# Patient Record
Sex: Male | Born: 2000 | Race: White | Hispanic: No | Marital: Single | Smoking: Never smoker
Health system: Southern US, Community
[De-identification: ages and names within clinical notes are randomized; demographics above are authoritative.]

## PROBLEM LIST (undated history)

## (undated) DIAGNOSIS — J45909 Unspecified asthma, uncomplicated: Secondary | ICD-10-CM

## (undated) HISTORY — DX: Unspecified asthma, uncomplicated: J45.909

## (undated) HISTORY — PX: WISDOM TOOTH EXTRACTION: SHX21

---
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
## 2019-02-15 ENCOUNTER — Other Ambulatory Visit: Payer: Self-pay | Admitting: Pediatrics

## 2019-02-16 ENCOUNTER — Other Ambulatory Visit: Payer: Self-pay

## 2019-02-16 ENCOUNTER — Other Ambulatory Visit: Payer: Self-pay | Admitting: Pediatrics

## 2019-02-16 MED ORDER — ALBUTEROL SULFATE HFA 108 (90 BASE) MCG/ACT IN AERS: INHALATION_SPRAY | RESPIRATORY_TRACT | 0 refills | Status: DC

## 2019-02-16 NOTE — Telephone Encounter (Signed)
Patient called stating that the pharmacy needed an authorization for his albuterol (Proair). [facility] states that [facility] sent [facility] a request but it is still not filled.   Please advise.

## 2019-03-02 ENCOUNTER — Telehealth: Payer: Self-pay | Admitting: Allergy

## 2019-03-02 NOTE — Telephone Encounter (Signed)
Spoke with patient. Patient verbalizes that he does not have COVID, but has had a possible exposure. Patient was scheduled for a tele-vist on Monday with Dr. Maudie Mercury.

## 2019-03-02 NOTE — Telephone Encounter (Signed)
Mom called requesting refills for an albuterol inhaler, capsules for the nebulizer machine and prednisone. Jeffrey Olson has Covid and she said the prednisone is helping. [facility] [address]

## 2019-03-05 ENCOUNTER — Other Ambulatory Visit: Payer: Self-pay

## 2019-03-05 ENCOUNTER — Encounter: Payer: Self-pay | Admitting: Allergy

## 2019-03-05 ENCOUNTER — Ambulatory Visit (INDEPENDENT_AMBULATORY_CARE_PROVIDER_SITE_OTHER): Payer: Medicaid Other | Admitting: Allergy

## 2019-03-05 DIAGNOSIS — J4551 Severe persistent asthma with (acute) exacerbation: Secondary | ICD-10-CM

## 2019-03-05 DIAGNOSIS — U071 COVID-19: Secondary | ICD-10-CM | POA: Insufficient documentation

## 2019-03-05 DIAGNOSIS — J3089 Other allergic rhinitis: Secondary | ICD-10-CM

## 2019-03-05 MED ORDER — BREO ELLIPTA 200-25 MCG/INH IN AEPB: 1.0000 | INHALATION_SPRAY | Freq: Every day | RESPIRATORY_TRACT | 5 refills | Status: DC

## 2019-03-05 MED ORDER — FLOVENT HFA 220 MCG/ACT IN AERO: 2.0000 | INHALATION_SPRAY | Freq: Two times a day (BID) | RESPIRATORY_TRACT | 3 refills | Status: DC

## 2019-03-05 MED ORDER — PREDNISONE 10 MG PO TABS: ORAL_TABLET | ORAL | 0 refills | Status: DC

## 2019-03-05 MED ORDER — ALBUTEROL SULFATE HFA 108 (90 BASE) MCG/ACT IN AERS: INHALATION_SPRAY | RESPIRATORY_TRACT | 1 refills | Status: DC

## 2019-03-05 NOTE — Assessment & Plan Note (Signed)
Current symptoms most likely related to COVID-19 and had positive exposure to girlfriend who tested positive. This would be his second infection. Diagnosed with COVID in July 2020.  Monitor symptoms.  CDC Isolation guidelines.  You can be around others after:  10 days since symptoms first appeared and  24 hours with no fever without the use of fever-reducing medications and  Other symptoms of COVID-19 are improving  If have acute worsening of symptoms please go to ER/urgent care for further evaluation.  If you are able to, check pulse oximetry and if below 94% please go to ER.  The following supplements may help:   Vitamin C 500mg  twice a day.   Vitamin D3 2000 - 4000 u/day  Zinc 75-100 mg/day

## 2019-03-05 NOTE — Assessment & Plan Note (Signed)
No recent testing. Consider retesting in future. Stable with no medications.   May use over the counter antihistamines such as Zyrtec (cetirizine), Claritin (loratadine), Allegra (fexofenadine), or Xyzal (levocetirizine) daily as needed.

## 2019-03-05 NOTE — Progress Notes (Signed)
RE: Jeffrey Olson MRN: [id number] DOB: 10/27/2000 Date of Telemedicine Visit: 03/05/2019  Referring provider: No ref. provider found Primary care provider: Patient, No Pcp Per  Chief Complaint: Asthma (girlfriend has covid-19, had it in june, has been quarantined, going through inhalers, having SOB)   Telemedicine Follow Up Visit via Telephone: I connected with Jeffrey Olson for a follow up on 03/05/19 by telephone and verified that I am speaking with the correct person using two identifiers.   I discussed the limitations, risks, security and privacy concerns of performing an evaluation and management service by telephone and the availability of in person appointments. I also discussed with the patient that there may be a patient responsible charge related to this service. The patient expressed understanding and agreed to proceed.  Patient is at home/work. Provider is at the office.  Visit start time: 3:14PM Visit end time: 3:37PM Insurance consent/check in by: front desk Medical consent and medical assistant/nurse: Darreld Mcleanrina S.  History of Present Illness: He is a 18 y.o. male, who is being followed for asthma and allergic rhinitis. His previous allergy office visit was on 04/06/2018 with Dr. Selena BattenKim. Today is a regular follow up visit.  Asthma: Asthma has been flaring the last 1-2 weeks.  He had COVID on 09/14/2018 but now his girlfriend has COVID and 10 days ago he started experiencing rhinorrhea, shortness of breath, fatigue, muscle pains, headaches, anosmia. Most of his symptoms are improving except for the breathing. He has been using albuterol throughout the day with some benefit.  He is still taking Breo 200 1 puff once a day. Patient does not have Flovent or Asmanex. Stopped Xolair due to transportation issues but would like to restart as they helped in the past.   No additional prednisone since the last visit.   Other allergic rhinitis Symptoms stable with no medications.    Assessment and Plan: Jeffrey Olson is a 18 y.o. male with: COVID-19 virus infection Current symptoms most likely related to COVID-19 and had positive exposure to girlfriend who tested positive. This would be his second infection. Diagnosed with COVID in July 2020.  Monitor symptoms.  CDC Isolation guidelines.  You can be around others after:  10 days since symptoms first appeared and  24 hours with no fever without the use of fever-reducing medications and  Other symptoms of COVID-19 are improving  If have acute worsening of symptoms please go to ER/urgent care for further evaluation.  If you are able to, check pulse oximetry and if below 94% please go to ER.  The following supplements may help:   Vitamin C 500mg  twice a day.   Vitamin D3 2000 - 4000 u/day  Zinc 75-100 mg/day  Other allergic rhinitis No recent testing. Consider retesting in future. Stable with no medications.   May use over the counter antihistamines such as Zyrtec (cetirizine), Claritin (loratadine), Allegra (fexofenadine), or Xyzal (levocetirizine) daily as needed.  Return in about 4 weeks (around 04/02/2019).  Meds ordered this encounter  Medications  . predniSONE (DELTASONE) 10 MG tablet    Sig: Take prednisone 40mg  daily x 2 days, 30mg  daily x 2 days, 20mg  daily x 2 days and 10mg  daily x 2 days.    Dispense:  20 tablet    Refill:  0  . fluticasone furoate-vilanterol (BREO ELLIPTA) 200-25 MCG/INH AEPB    Sig: Inhale 1 puff into the lungs daily.    Dispense:  28 each    Refill:  5  . fluticasone (FLOVENT HFA) 220  MCG/ACT inhaler    Sig: Inhale 2 puffs into the lungs 2 (two) times daily. For the next 2 weeks in addition to [place].    Dispense:  1 Inhaler    Refill:  3  . albuterol (PROAIR HFA) 108 (90 Base) MCG/ACT inhaler    Sig: INHALE 2 PUFFS BY MOUTH EVERY 4 HOURS AS NEEDED FOR COUGH/WHEEZING    Dispense:  18 g    Refill:  1   Diagnostics: None.  Medication List:  Current Outpatient  Medications  Medication Sig Dispense Refill  . albuterol (PROAIR HFA) 108 (90 Base) MCG/ACT inhaler INHALE 2 PUFFS BY MOUTH EVERY 4 HOURS AS NEEDED FOR COUGH/WHEEZING 18 g 1  . fluticasone furoate-vilanterol (BREO ELLIPTA) 200-25 MCG/INH AEPB Inhale 1 puff into the lungs daily. 28 each 5  . ibuprofen (ADVIL,MOTRIN) 200 MG tablet Take 400 mg by mouth daily as needed for moderate pain.    Marland Kitchen ipratropium-albuterol (DUONEB) 0.5-2.5 (3) MG/3ML SOLN Inhale 3 mLs into the lungs every 4 (four) hours as needed. 180 mL 2  . naproxen (NAPROSYN) 500 MG tablet Take 1 tablet (500 mg total) by mouth 2 (two) times daily with a meal. 10 tablet 0  . pantoprazole (PROTONIX) 40 MG tablet Take 1 tablet (40 mg total) by mouth daily. 90 tablet 3  . fluticasone (FLOVENT HFA) 220 MCG/ACT inhaler Inhale 2 puffs into the lungs 2 (two) times daily. For the next 2 weeks in addition to [facility]. 1 Inhaler 3  . omalizumab (XOLAIR) 150 MG injection Inject 375 mg into the skin every 14 (fourteen) days. (Patient not taking: Reported on 03/05/2019) 6 vial 11  . predniSONE (DELTASONE) 10 MG tablet Take prednisone 40mg  daily x 2 days, 30mg  daily x 2 days, 20mg  daily x 2 days and 10mg  daily x 2 days. 20 tablet 0   Current Facility-Administered Medications  Medication Dose Route Frequency Provider Last Rate Last Admin  . omalizumab ) injection 375 mg  375 mg Subcutaneous Q14 Days , MD   375 mg at 05/17/18 1706   Allergies: Allergies  Allergen Reactions  . Apple Swelling    Oral swelling   . Peach [Prunus Persica] Swelling  . Plum Pulp Swelling  . Shellfish Allergy Other (See Comments)    Allergy testing    I reviewed his past medical history, social history, family history, and environmental history and no significant changes have been reported from his previous visit.  Review of Systems  Constitutional: Negative for appetite change, chills, fever and unexpected weight change.  HENT: Positive for  rhinorrhea. Negative for congestion.   Eyes: Negative for itching.  Respiratory: Positive for cough, chest tightness, shortness of breath and wheezing.   Gastrointestinal: Negative for abdominal pain.  Skin: Negative for rash.  Neurological: Negative for headaches.   Objective: Physical Exam Not obtained as encounter was done via telephone.  Patient was speaking in full sentences on the phone with no respiratory distress.   Previous notes and tests were reviewed.  I discussed the assessment and treatment plan with the patient. The patient was provided an opportunity to ask questions and all were answered. The patient agreed with the plan and demonstrated an understanding of the instructions. After visit summary/patient instructions available via e-mail. [email or web address]   The patient was advised to call back or seek an in-person evaluation if the symptoms worsen or if the condition fails to improve as anticipated.  I provided 23 minutes of non-face-to-face time during this encounter.  It  was my pleasure to participate in [place] Lumley's care today. Please feel free to contact me with any questions or concerns.   Sincerely,  Rexene Alberts, DO Allergy & Immunology  [facility] office: [phone] [facility] office: [place] office: [phone]

## 2019-03-05 NOTE — Patient Instructions (Addendum)
Asthma:   Start prednisone taper.  ADD on Flovent 220 2 puffs twice a day with spacer and rinse mouth afterwards for the next 2 weeks.   Daily controller medication(s):continue Breo 200 1 puff once a day and rinse mouth afterwards.   Prior to physical activity:May use albuterol rescue inhaler 2 puffs 5 to 15 minutes prior to strenuous physical activities.  Rescue medications:May use albuterol rescue inhaler 2 puffs or nebulizer every 4 to 6 hours as needed for shortness of breath, chest tightness, coughing, and wheezing. Monitor frequency of use.   During upper respiratory infections: Start Flovent  220 2 puffs twice a day for 1-2 weeks.  Asthma control goals:  Full participation in all desired activities (may need albuterol before activity) Albuterol use two times or less a week on average (not counting use with activity) Cough interfering with sleep two times or less a month Oral steroids no more than once a year No hospitalizations  Will discuss re-starting Xolair at next visit.   COVID-19:  Monitor symptoms.  CDC Isolation guidelines.  You can be around others after:  10 days since symptoms first appeared and  24 hours with no fever without the use of fever-reducing medications and  Other symptoms of COVID-19 are improving  If have acute worsening of symptoms please go to ER/urgent care for further evaluation.  If you are able to, check pulse oximetry and if below 94% please go to ER.  The following supplements may help:   Vitamin C 500mg  twice a day.   Vitamin D3 2000 - 4000 u/day  Zinc 75-100 mg/day  Other allergic rhinitis No recent testing. Consider retesting in future.  May use over the counter antihistamines such as Zyrtec (cetirizine), Claritin (loratadine), Allegra (fexofenadine), or Xyzal (levocetirizine) daily as needed.  Follow up in 4 weeks  Sincerely,  Rexene Alberts, DO Allergy & Immunology  [facility] [place] [facility] office: [phone] [facility] office: [place] office: [phone]

## 2019-04-02 ENCOUNTER — Ambulatory Visit: Payer: Medicaid Other | Admitting: Allergy

## 2019-04-02 NOTE — Progress Notes (Deleted)
Follow Up Note  RE: Jeffrey Olson MRN: [id number] DOB: 01-27-2001 Date of Office Visit: 04/02/2019  Referring provider: No ref. provider found Primary care provider: Patient, No Pcp Per  Chief Complaint: No chief complaint on file.  History of Present Illness: I had the pleasure of seeing Jeffrey Olson for a follow up visit at the [facility] [place] on 04/02/2019. He is a 19 y.o. male, who is being followed for allergic rhinitis, asthma, GERD and recent COVID-19 infection. His previous allergy office visit was on 03/05/2019 with Dr. Selena Batten via telemedicine. Today is a regular follow up visit.  COVID-19 virus infection Current symptoms most likely related to COVID-19 and had positive exposure to girlfriend who tested positive. This would be his second infection. Diagnosed with COVID in July 2020.  Monitor symptoms.  CDC Isolation guidelines. ? You can be around others after:  10 days since symptoms first appeared and  24 hours with no fever without the use of fever-reducing medications and  Other symptoms of COVID-19 are improving  If have acute worsening of symptoms please go to ER/urgent care for further evaluation.  If you are able to, check pulse oximetry and if below 94% please go to ER.  The following supplements may help:  ? Vitamin C 500mg  twice a day.  ? Vitamin D3 2000 - 4000 u/day ? Zinc 75-100 mg/day  Other allergic rhinitis No recent testing. Consider retesting in future. Stable with no medications.   May use over the counter antihistamines such as Zyrtec (cetirizine), Claritin (loratadine), Allegra (fexofenadine), or Xyzal (levocetirizine) daily as needed.  Return in about 4 weeks (around 04/02/2019).  Asthma, not well controlled, severe persistent, with acute exacerbation Patient has been having issues with his breathing and using rescue inhaler multiple times a day. He ran out of his maintenance inhalers 2 months ago and last Xolair injection  was in July 2019.  Today's spirometry showed: mild obstructive disease with 30% improvement in FEV1 post bronchodilator treatment.  He is currently having an asthma exacerbation most likely due to medication non-compliance.   Patient will call our biologics coordinator to set up restarting Xolair injections.   Start prednisone taper.  Daily controller medication(s):restart Breo 200 1 puff once a day and rinse mouth afterwards. Sample given.  ? Restart Xolair 375mg  injections every 2 weeks. Has Epipen on hand if needed.   Prior to physical activity:May use albuterol rescue inhaler 2 puffs 5 to 15 minutes prior to strenuous physical activities.  Rescue medications:May use albuterol rescue inhaler 2 puffs or nebulizer every 4 to 6 hours as needed for shortness of breath, chest tightness, coughing, and wheezing. Monitor frequency of use.   During upper respiratory infections: Start Asmanex 220 2 puffs twice a day for 1-2 weeks.   Assessment and Plan: Jeffrey Olson is a 19 y.o. male with: No problem-specific Assessment & Plan notes found for this encounter.  No follow-ups on file.  No orders of the defined types were placed in this encounter.  Lab Orders  No laboratory test(s) ordered today    Diagnostics: Spirometry:  Tracings reviewed. His effort: {Blank single:19197::"Good reproducible efforts.","It was hard to get consistent efforts and there is a question as to whether this reflects a maximal maneuver.","Poor effort, data can not be interpreted."} FVC: ***L FEV1: ***L, ***% predicted FEV1/FVC ratio: ***% Interpretation: {Blank single:19197::"Spirometry consistent with mild obstructive disease","Spirometry consistent with moderate obstructive disease","Spirometry consistent with severe obstructive disease","Spirometry consistent with possible restrictive disease","Spirometry consistent with mixed  obstructive and restrictive disease","Spirometry uninterpretable due to  technique","Spirometry consistent with normal pattern","No overt abnormalities noted given today's efforts"}.  Please see scanned spirometry results for details.  Skin Testing: {Blank single:19197::"Select foods","Environmental allergy panel","Environmental allergy panel and select foods","Food allergy panel","None","Deferred due to recent antihistamines use"}. Positive test to: ***. Negative test to: ***.  Results discussed with patient/family.   Medication List:  Current Outpatient Medications  Medication Sig Dispense Refill  . albuterol (PROAIR HFA) 108 (90 Base) MCG/ACT inhaler INHALE 2 PUFFS BY MOUTH EVERY 4 HOURS AS NEEDED FOR COUGH/WHEEZING 18 g 1  . fluticasone (FLOVENT HFA) 220 MCG/ACT inhaler Inhale 2 puffs into the lungs 2 (two) times daily. For the next 2 weeks in addition to [facility]. 1 Inhaler 3  . fluticasone furoate-vilanterol (BREO ELLIPTA) 200-25 MCG/INH AEPB Inhale 1 puff into the lungs daily. 28 each 5  . ibuprofen (ADVIL,MOTRIN) 200 MG tablet Take 400 mg by mouth daily as needed for moderate pain.    Marland Kitchen ipratropium-albuterol (DUONEB) 0.5-2.5 (3) MG/3ML SOLN Inhale 3 mLs into the lungs every 4 (four) hours as needed. 180 mL 2  . naproxen (NAPROSYN) 500 MG tablet Take 1 tablet (500 mg total) by mouth 2 (two) times daily with a meal. 10 tablet 0  . omalizumab (XOLAIR) 150 MG injection Inject 375 mg into the skin every 14 (fourteen) days. (Patient not taking: Reported on 03/05/2019) 6 vial 11  . pantoprazole (PROTONIX) 40 MG tablet Take 1 tablet (40 mg total) by mouth daily. 90 tablet 3  . predniSONE (DELTASONE) 10 MG tablet Take prednisone 40mg  daily x 2 days, 30mg  daily x 2 days, 20mg  daily x 2 days and 10mg  daily x 2 days. 20 tablet 0   Current Facility-Administered Medications  Medication Dose Route Frequency Provider Last Rate Last Admin  . omalizumab ) injection 375 mg  375 mg Subcutaneous Q14 Days , MD   375 mg at 05/17/18 1706    Allergies: Allergies  Allergen Reactions  . Apple Swelling    Oral swelling   . Peach [Prunus Persica] Swelling  . Plum Pulp Swelling  . Shellfish Allergy Other (See Comments)    Allergy testing    I reviewed his past medical history, social history, family history, and environmental history and no significant changes have been reported from his previous visit.  Review of Systems  Constitutional: Negative for appetite change, chills, fever and unexpected weight change.  HENT: Positive for rhinorrhea. Negative for congestion.   Eyes: Negative for itching.  Respiratory: Positive for cough, chest tightness, shortness of breath and wheezing.   Gastrointestinal: Negative for abdominal pain.  Skin: Negative for rash.  Neurological: Negative for headaches.   Objective: There were no vitals taken for this visit. There is no height or weight on file to calculate BMI. Physical Exam  Constitutional: He is oriented to person, place, and time. He appears well-developed and well-nourished.  HENT:  Head: Normocephalic and atraumatic.  Right Ear: External ear normal.  Left Ear: External ear normal.  Nose: Nose normal.  Mouth/Throat: Oropharynx is clear and moist.  Eyes: Conjunctivae and EOM are normal.  Cardiovascular: Normal rate, regular rhythm and normal heart sounds. Exam reveals no gallop and no friction rub.  No murmur heard. Pulmonary/Chest: Effort normal. He has wheezes. He has no rales.  Musculoskeletal:     Cervical back: Neck supple.  Lymphadenopathy:    He has no cervical adenopathy.  Neurological: He is alert and oriented to person, place, and time.  Skin: Skin is warm. No rash noted.  Psychiatric: He has a normal mood and affect. His behavior is normal.  Nursing note and vitals reviewed.  Previous notes and tests were reviewed. The plan was reviewed with the patient/family, and all questions/concerned were addressed.  It was my pleasure to see Avinash today and  participate in his care. Please feel free to contact me with any questions or concerns.  Sincerely,  Rexene Alberts, DO Allergy & Immunology  [facility] office: [phone] [facility] office: [place] office: [phone]

## 2019-05-14 ENCOUNTER — Other Ambulatory Visit: Payer: Self-pay | Admitting: Allergy

## 2019-05-15 ENCOUNTER — Other Ambulatory Visit: Payer: Self-pay | Admitting: *Deleted

## 2019-05-15 ENCOUNTER — Telehealth: Payer: Self-pay | Admitting: *Deleted

## 2019-05-15 MED ORDER — IPRATROPIUM-ALBUTEROL 0.5-2.5 (3) MG/3ML IN SOLN: 3.0000 mL | RESPIRATORY_TRACT | 1 refills | Status: DC | PRN

## 2019-05-15 NOTE — Telephone Encounter (Signed)
PA has been submitted for Breo through [facility] and is currently suspended/ pending.

## 2019-05-16 NOTE — Telephone Encounter (Signed)
PA for Virgel Bouquet has been approved. PA form has been faxed to patient's pharmacy, labeled, and placed in bulk scanning.

## 2019-05-28 ENCOUNTER — Other Ambulatory Visit: Payer: Self-pay | Admitting: *Deleted

## 2019-05-30 ENCOUNTER — Other Ambulatory Visit: Payer: Self-pay | Admitting: *Deleted

## 2019-08-02 ENCOUNTER — Other Ambulatory Visit: Payer: Self-pay | Admitting: Allergy

## 2019-08-07 NOTE — Progress Notes (Signed)
Follow Up Note  RE: Jeffrey Olson MRN: [id number] DOB: 10/28/2000 Date of Office Visit: 08/08/2019  Referring provider: No ref. provider found Primary care provider: Patient, No Pcp Per  Chief Complaint: Follow-up and Asthma  History of Present Illness: I had the pleasure of seeing Jeffrey Olson for a follow up visit at the [facility] [place] on 08/08/2019. He is a 19 y.o. male, who is being followed for asthma and allergic rhinitis. His previous allergy office visit was on 03/05/2019 with Dr. Selena Batten via telemedicine. Today is a regular follow up visit. He is accompanied today by his mother who provided/contributed to the history.  Up to date with COVID-19 vaccine: no   Asthma: Patient has been having issues with shortness of breath, wheezing. He is experiencing these issues almost on a daily basis.  Patient was on Xolair injections with good benefit and no reactions but had a hard time coming in every 2 weeks.   Currently on Breo 200 1 puff 4 days out of the week and taking it twice a day instead of once a day.  Taking albuterol 2 times a day which helps and has been running out of albuterol and using nebulizer instead at times.  No specific triggers noted.   Currently smoking marijuana.  Patient took prednisone which he had at home for 3 days with some benefit.  Patient tried Spiriva Respimat for about 6 months with unknown benefit.  Other allergic rhinitis No issues and not taking any antihistamines. Had large localized reactions the last time they did skin prick testing.  Foods: Fresh tree fruits causing perioral pruritus.  Tolerates shrimp but avoiding other shellfish due to positive skin testing in the past.   2015 skin testing was positive to grass, weed, ragweed, trees, mold, dust mite, cat, dog, cockroach. Positive to shrimp and crab.  Assessment and Plan: Jeffrey Olson is a 19 y.o. male with: Not well controlled severe persistent asthma Patient has been  having issues with his breathing and using rescue inhaler twice a day with good benefit. No triggers noted. Took prednisone for 3 days due to his asthma this week. Last Xolair injection was in March 2020 which helped but found it difficult to come into the office every 2 weeks.  Today's ACT score 9.  Today's spirometry showed: normal but recently took prednisone.  Get bloodwork in 2 weeks (checking eosinophils and IgE level) - no prednisone for 2 weeks before the bloodwork.  Depending on results will choose asthma biologics. Preferably one that is given less frequently and offers self injection at home.   Discussed smoking cessation.   Daily controller medication(s):continue Breo 200 1 puff ONCE a day and rinse mouth afterwards.   Start Spiriva respimat 1.24mcg 2 puffs once a day. Sample given. Demonstrated proper use.   May use albuterol rescue inhaler 2 puffs or nebulizer every 4 to 6 hours as needed for shortness of breath, chest tightness, coughing, and wheezing. May use albuterol rescue inhaler 2 puffs 5 to 15 minutes prior to strenuous physical activities. Monitor frequency of use.   Repeat spirometry at next visit.   Other allergic rhinitis Past history - 2015 skin testing was positive to grass, weed, ragweed, trees, mold, dust mite, cat, dog, cockroach. Interim history - Stable with no medications.   May use over the counter antihistamines such as Zyrtec (cetirizine), Claritin (loratadine), Allegra (fexofenadine), or Xyzal (levocetirizine) daily as needed.  Will check allergy panel via bloodwork with above lab orders  for asthma.   Pollen-food allergy Fresh tree fruits cause perioral pruritus.  Discussed that his food triggered oral and throat symptoms are likely caused by oral food allergy syndrome (OFAS). This is caused by cross reactivity of pollen with fresh fruits and vegetables, and nuts. Symptoms are usually localized in the form of itching and burning in mouth and  throat. Very rarely it can progress to more severe symptoms. Eating foods in cooked or processed forms usually minimizes symptoms. I recommended avoidance of eating the problem foods, especially during the peak season(s). Sometimes, OFAS can induce severe throat swelling or even a systemic reaction; with such instance, I advised them to report to a local ER. A list of common pollens and food cross-reactivities was provided to the patient.   Adverse reaction to food, subsequent encounter Past history - 2015 skin testing positive to shrimp and crab. Interim history - tolerates shrimp with no issues.   Continue to avoid shellfish that bothers you. Will get bloodwork.  I have prescribed epinephrine injectable and demonstrated proper use. For mild symptoms you can take over the counter antihistamines such as Benadryl and monitor symptoms closely. If symptoms worsen or if you have severe symptoms including breathing issues, throat closure, significant swelling, whole body hives, severe diarrhea and vomiting, lightheadedness then inject epinephrine and seek immediate medical care afterwards.  Food action plan given.   Discussed that his food triggered oral and throat symptoms are likely caused by oral food allergy syndrome (OFAS). This is caused by cross reactivity of pollen with fresh fruits and vegetables, and nuts. Symptoms are usually localized in the form of itching and burning in mouth and throat. Very rarely it can progress to more severe symptoms. Eating foods in cooked or processed forms usually minimizes symptoms. I recommended avoidance of eating the problem foods, especially during the peak season(s). Sometimes, OFAS can induce severe throat swelling or even a systemic reaction. A list of common pollens and food cross-reactivities was provided to the patient.   Return in about 4 weeks (around 09/05/2019).  Meds ordered this encounter  Medications  . fluticasone furoate-vilanterol (BREO ELLIPTA)  200-25 MCG/INH AEPB    Sig: Inhale 1 puff into the lungs daily. Rinse mouth after each use.    Dispense:  28 each    Refill:  5  . albuterol (VENTOLIN HFA) 108 (90 Base) MCG/ACT inhaler    Sig: INHALE 2 PUFFS BY MOUTH EVERY 4 HOURS AS NEEDED FOR COUGH/WHEEZING    Dispense:  18 g    Refill:  2  . Tiotropium Bromide Monohydrate (SPIRIVA RESPIMAT) 1.25 MCG/ACT AERS    Sig: 2 puffs once a day    Dispense:  4 g    Refill:  5  . ipratropium-albuterol (DUONEB) 0.5-2.5 (3) MG/3ML SOLN    Sig: Inhale 3 mLs into the lungs every 4 (four) hours as needed.    Dispense:  180 mL    Refill:  1  . EPINEPHrine 0.3 mg/0.3 mL IJ SOAJ injection    Sig: Inject 0.3 mLs (0.3 mg total) into the muscle as needed for anaphylaxis.    Dispense:  1 each    Refill:  2    May dispense generic Mylan brand.    Lab Orders     Allergens w/Total IgE Area 2     CBC with Differential/Platelet     Allergen Profile, Shellfish  Diagnostics: Spirometry:  Tracings reviewed. His effort: Good reproducible efforts. FVC: 5.47L FEV1: 4.23L, 93% predicted FEV1/FVC ratio: 77%  Interpretation: Spirometry consistent with normal pattern.  Please see scanned spirometry results for details.  Medication List:  Current Outpatient Medications  Medication Sig Dispense Refill  . albuterol (VENTOLIN HFA) 108 (90 Base) MCG/ACT inhaler INHALE 2 PUFFS BY MOUTH EVERY 4 HOURS AS NEEDED FOR COUGH/WHEEZING 18 g 2  . fluticasone furoate-vilanterol (BREO ELLIPTA) 200-25 MCG/INH AEPB Inhale 1 puff into the lungs daily. Rinse mouth after each use. 28 each 5  . ibuprofen (ADVIL,MOTRIN) 200 MG tablet Take 400 mg by mouth daily as needed for moderate pain.    Marland Kitchen ipratropium-albuterol (DUONEB) 0.5-2.5 (3) MG/3ML SOLN Inhale 3 mLs into the lungs every 4 (four) hours as needed. 180 mL 1  . pantoprazole (PROTONIX) 40 MG tablet Take 1 tablet (40 mg total) by mouth daily. 90 tablet 3  . EPINEPHrine 0.3 mg/0.3 mL IJ SOAJ injection Inject 0.3 mLs (0.3 mg  total) into the muscle as needed for anaphylaxis. 1 each 2  . naproxen (NAPROSYN) 500 MG tablet Take 1 tablet (500 mg total) by mouth 2 (two) times daily with a meal. (Patient not taking: Reported on 08/08/2019) 10 tablet 0  . Tiotropium Bromide Monohydrate (SPIRIVA RESPIMAT) 1.25 MCG/ACT AERS 2 puffs once a day 4 g 5   Current Facility-Administered Medications  Medication Dose Route Frequency Provider Last Rate Last Admin  . omalizumab Geoffry Paradise) injection 375 mg  375 mg Subcutaneous Q14 Days Marcelyn Bruins, MD   375 mg at 05/17/18 1706   Allergies: Allergies  Allergen Reactions  . Apple Swelling    Oral swelling   . Peach [Prunus Persica] Swelling  . Plum Pulp Swelling  . Shellfish Allergy Other (See Comments)    Allergy testing    I reviewed his past medical history, social history, family history, and environmental history and no significant changes have been reported from his previous visit.  Review of Systems  Constitutional: Negative for appetite change, chills, fever and unexpected weight change.  HENT: Negative for congestion and rhinorrhea.   Eyes: Negative for itching.  Respiratory: Positive for shortness of breath and wheezing. Negative for cough and chest tightness.   Gastrointestinal: Negative for abdominal pain.  Skin: Negative for rash.  Allergic/Immunologic: Positive for environmental allergies and food allergies.  Neurological: Negative for headaches.   Objective: BP 112/82 (BP Location: Right Arm, Patient Position: Sitting, Cuff Size: Large)   Pulse 76   Temp 98.2 F (36.8 C) (Temporal)   Resp 18   Ht 5\' 9"  (1.753 m)   Wt 222 lb 6.4 oz (100.9 kg)   SpO2 97%   BMI 32.84 kg/m  Body mass index is 32.84 kg/m. Physical Exam  Constitutional: He is oriented to person, place, and time. He appears well-developed and well-nourished.  HENT:  Head: Normocephalic and atraumatic.  Right Ear: External ear normal.  Left Ear: External ear normal.  Nose:  Nose normal.  Mouth/Throat: Oropharynx is clear and moist.  Eyes: Conjunctivae and EOM are normal.  Cardiovascular: Normal rate, regular rhythm and normal heart sounds. Exam reveals no gallop and no friction rub.  No murmur heard. Pulmonary/Chest: Effort normal and breath sounds normal. He has no wheezes. He has no rales.  Musculoskeletal:     Cervical back: Neck supple.  Neurological: He is alert and oriented to person, place, and time.  Skin: Skin is warm. No rash noted.  Psychiatric: He has a normal Olson and affect. His behavior is normal.  Nursing note and vitals reviewed.  Previous notes and tests were reviewed. The  plan was reviewed with the patient/family, and all questions/concerned were addressed.  It was my pleasure to see Jeffrey Olson today and participate in his care. Please feel free to contact me with any questions or concerns.  Sincerely,  Jeffrey Mood, DO Allergy & Immunology  [facility] office: [phone] [facility] [place] office: [phone] [place] office: [phone]

## 2019-08-08 ENCOUNTER — Ambulatory Visit (INDEPENDENT_AMBULATORY_CARE_PROVIDER_SITE_OTHER): Payer: Medicaid Other | Admitting: Allergy

## 2019-08-08 ENCOUNTER — Encounter: Payer: Self-pay | Admitting: Allergy

## 2019-08-08 ENCOUNTER — Other Ambulatory Visit: Payer: Self-pay

## 2019-08-08 VITALS — BP 112/82 | HR 76 | Temp 98.2°F | Resp 18 | Ht 69.0 in | Wt 222.4 lb

## 2019-08-08 DIAGNOSIS — T781XXD Other adverse food reactions, not elsewhere classified, subsequent encounter: Secondary | ICD-10-CM | POA: Diagnosis not present

## 2019-08-08 DIAGNOSIS — T781XXA Other adverse food reactions, not elsewhere classified, initial encounter: Secondary | ICD-10-CM | POA: Insufficient documentation

## 2019-08-08 DIAGNOSIS — T7819XD Other adverse food reactions, not elsewhere classified, subsequent encounter: Secondary | ICD-10-CM | POA: Insufficient documentation

## 2019-08-08 DIAGNOSIS — J4550 Severe persistent asthma, uncomplicated: Secondary | ICD-10-CM

## 2019-08-08 DIAGNOSIS — J3089 Other allergic rhinitis: Secondary | ICD-10-CM

## 2019-08-08 MED ORDER — BREO ELLIPTA 200-25 MCG/INH IN AEPB: 1.0000 | INHALATION_SPRAY | Freq: Every day | RESPIRATORY_TRACT | 5 refills | Status: DC

## 2019-08-08 MED ORDER — EPINEPHRINE 0.3 MG/0.3ML IJ SOAJ: 0.3000 mg | INTRAMUSCULAR | 2 refills | Status: DC | PRN

## 2019-08-08 MED ORDER — IPRATROPIUM-ALBUTEROL 0.5-2.5 (3) MG/3ML IN SOLN: 3.0000 mL | RESPIRATORY_TRACT | 1 refills | Status: DC | PRN

## 2019-08-08 MED ORDER — SPIRIVA RESPIMAT 1.25 MCG/ACT IN AERS: INHALATION_SPRAY | RESPIRATORY_TRACT | 5 refills | Status: DC

## 2019-08-08 MED ORDER — ALBUTEROL SULFATE HFA 108 (90 BASE) MCG/ACT IN AERS: INHALATION_SPRAY | RESPIRATORY_TRACT | 2 refills | Status: DC

## 2019-08-08 NOTE — Assessment & Plan Note (Addendum)
Past history - 2015 skin testing positive to shrimp and crab. Interim history - tolerates shrimp with no issues.   Continue to avoid shellfish that bothers you. Will get bloodwork.  I have prescribed epinephrine injectable and demonstrated proper use. For mild symptoms you can take over the counter antihistamines such as Benadryl and monitor symptoms closely. If symptoms worsen or if you have severe symptoms including breathing issues, throat closure, significant swelling, whole body hives, severe diarrhea and vomiting, lightheadedness then inject epinephrine and seek immediate medical care afterwards.  Food action plan given.   Discussed that his food triggered oral and throat symptoms are likely caused by oral food allergy syndrome (OFAS). This is caused by cross reactivity of pollen with fresh fruits and vegetables, and nuts. Symptoms are usually localized in the form of itching and burning in mouth and throat. Very rarely it can progress to more severe symptoms. Eating foods in cooked or processed forms usually minimizes symptoms. I recommended avoidance of eating the problem foods, especially during the peak season(s). Sometimes, OFAS can induce severe throat swelling or even a systemic reaction. A list of common pollens and food cross-reactivities was provided to the patient.

## 2019-08-08 NOTE — Assessment & Plan Note (Addendum)
Fresh tree fruits cause perioral pruritus.  Discussed that his food triggered oral and throat symptoms are likely caused by oral food allergy syndrome (OFAS). This is caused by cross reactivity of pollen with fresh fruits and vegetables, and nuts. Symptoms are usually localized in the form of itching and burning in mouth and throat. Very rarely it can progress to more severe symptoms. Eating foods in cooked or processed forms usually minimizes symptoms. I recommended avoidance of eating the problem foods, especially during the peak season(s). Sometimes, OFAS can induce severe throat swelling or even a systemic reaction; with such instance, I advised them to report to a local ER. A list of common pollens and food cross-reactivities was provided to the patient.

## 2019-08-08 NOTE — Assessment & Plan Note (Addendum)
Patient has been having issues with his breathing and using rescue inhaler twice a day with good benefit. No triggers noted. Took prednisone for 3 days due to his asthma this week. Last Xolair injection was in March 2020 which helped but found it difficult to come into the office every 2 weeks.  Today's ACT score 9.  Today's spirometry showed: normal but recently took prednisone.  Get bloodwork in 2 weeks (checking eosinophils and IgE level) - no prednisone for 2 weeks before the bloodwork.  Depending on results will choose asthma biologics. Preferably one that is given less frequently and offers self injection at home.   Discussed smoking cessation.   Daily controller medication(s):continue Breo 200 1 puff ONCE a day and rinse mouth afterwards.   Start Spiriva respimat 1.76mcg 2 puffs once a day. Sample given. Demonstrated proper use.   May use albuterol rescue inhaler 2 puffs or nebulizer every 4 to 6 hours as needed for shortness of breath, chest tightness, coughing, and wheezing. May use albuterol rescue inhaler 2 puffs 5 to 15 minutes prior to strenuous physical activities. Monitor frequency of use.   Repeat spirometry at next visit.

## 2019-08-08 NOTE — Assessment & Plan Note (Signed)
Past history - 2015 skin testing was positive to grass, weed, ragweed, trees, mold, dust mite, cat, dog, cockroach. Interim history - Stable with no medications.   May use over the counter antihistamines such as Zyrtec (cetirizine), Claritin (loratadine), Allegra (fexofenadine), or Xyzal (levocetirizine) daily as needed.  Will check allergy panel via bloodwork with above lab orders for asthma.

## 2019-08-08 NOTE — Patient Instructions (Addendum)
Asthma:   Get bloodwork in 2 weeks - no prednisone for 2 weeks before the bloodwork.  Depending on results we will start you on an injectable medication for asthma.   Stop smoking!  Daily controller medication(s):continue Breo 200 1 puff ONCE a day and rinse mouth afterwards.   Start Spiriva respimat 1.65mcg 2 puffs once a day. Sample given. Demonstrated proper use.   May use albuterol rescue inhaler 2 puffs or nebulizer every 4 to 6 hours as needed for shortness of breath, chest tightness, coughing, and wheezing. May use albuterol rescue inhaler 2 puffs 5 to 15 minutes prior to strenuous physical activities. Monitor frequency of use.  Asthma control goals:  Full participation in all desired activities (may need albuterol before activity) Albuterol use two times or less a week on average (not counting use with activity) Cough interfering with sleep two times or less a month Oral steroids no more than once a year No hospitalizations  Other allergic rhinitis  Will get bloodwork.   May use over the counter antihistamines such as Zyrtec (cetirizine), Claritin (loratadine), Allegra (fexofenadine), or Xyzal (levocetirizine) daily as needed.  Food:  Continue to avoid shellfish that bothers you. Will get bloodwork.  I have prescribed epinephrine injectable and demonstrated proper use. For mild symptoms you can take over the counter antihistamines such as Benadryl and monitor symptoms closely. If symptoms worsen or if you have severe symptoms including breathing issues, throat closure, significant swelling, whole body hives, severe diarrhea and vomiting, lightheadedness then inject epinephrine and seek immediate medical care afterwards.  Food action plan given.   Discussed that his food triggered oral and throat symptoms are likely caused by oral food allergy syndrome (OFAS). This is caused by cross reactivity of pollen with fresh fruits and vegetables, and nuts. Symptoms are usually  localized in the form of itching and burning in mouth and throat. Very rarely it can progress to more severe symptoms. Eating foods in cooked or processed forms usually minimizes symptoms. I recommended avoidance of eating the problem foods, especially during the peak season(s). Sometimes, OFAS can induce severe throat swelling or even a systemic reaction. A list of common pollens and food cross-reactivities was provided to the patient.   Follow up in 4 weeks or sooner if needed.

## 2019-08-09 ENCOUNTER — Telehealth: Payer: Self-pay | Admitting: *Deleted

## 2019-08-09 NOTE — Telephone Encounter (Signed)
PA for Spiriva 1.25 has been initiated through [employer] and has been approved. PA form has been faxed to patient's pharmacy, labeled, and placed in bulk scanning.

## 2019-10-02 ENCOUNTER — Other Ambulatory Visit: Payer: Self-pay | Admitting: Allergy

## 2019-11-27 ENCOUNTER — Encounter: Payer: Self-pay | Admitting: Allergy & Immunology

## 2019-11-27 ENCOUNTER — Ambulatory Visit (INDEPENDENT_AMBULATORY_CARE_PROVIDER_SITE_OTHER): Payer: Medicaid Other | Admitting: Allergy & Immunology

## 2019-11-27 ENCOUNTER — Other Ambulatory Visit: Payer: Self-pay

## 2019-11-27 VITALS — BP 124/76 | HR 86 | Temp 98.4°F | Resp 18 | Ht 69.0 in | Wt 225.0 lb

## 2019-11-27 DIAGNOSIS — J4540 Moderate persistent asthma, uncomplicated: Secondary | ICD-10-CM

## 2019-11-27 DIAGNOSIS — T781XXD Other adverse food reactions, not elsewhere classified, subsequent encounter: Secondary | ICD-10-CM | POA: Diagnosis not present

## 2019-11-27 DIAGNOSIS — T7800XD Anaphylactic reaction due to unspecified food, subsequent encounter: Secondary | ICD-10-CM

## 2019-11-27 DIAGNOSIS — J3089 Other allergic rhinitis: Secondary | ICD-10-CM

## 2019-11-27 DIAGNOSIS — J4550 Severe persistent asthma, uncomplicated: Secondary | ICD-10-CM | POA: Diagnosis not present

## 2019-11-27 DIAGNOSIS — J302 Other seasonal allergic rhinitis: Secondary | ICD-10-CM | POA: Diagnosis not present

## 2019-11-27 MED ORDER — ALBUTEROL SULFATE HFA 108 (90 BASE) MCG/ACT IN AERS
2.0000 | INHALATION_SPRAY | RESPIRATORY_TRACT | 1 refills | Status: DC | PRN
Start: 2019-11-27 — End: 2020-03-18

## 2019-11-27 MED ORDER — EPINEPHRINE 0.3 MG/0.3ML IJ SOAJ: 0.3000 mg | INTRAMUSCULAR | 1 refills | Status: DC | PRN

## 2019-11-27 NOTE — Patient Instructions (Addendum)
1. Moderate persistent asthma without complication - Lung testing looks great today. - Stop the Breo and Spiriva and start Trelegy once puff once daily instead. - This contains all of the medications that you are getting, but just in one inhaler.  - Daily controller medication(s): Trelegy 200/62.5/25 one puff once daily - Prior to physical activity: albuterol 2 puffs 10-15 minutes before physical activity. - Rescue medications: albuterol 4 puffs every 4-6 hours as needed - Asthma control goals:  * Full participation in all desired activities (may need albuterol before activity) * Albuterol use two time or less a week on average (not counting use with activity) * Cough interfering with sleep two time or less a month * Oral steroids no more than once a year * No hospitalizations  2. Anaphylactic shock due to food (shellfish) - We are going to check on your allergy levels. - EpiPen refilled.   3. Seasonal and perennial allergic rhinitis - Continue with the as needed antihistamine. - We are going to recheck your environmental allergy levels.   4. Return in about 6 months (around 05/26/2020). We are offering the COVID19 shot [employer] and [place] and [place]) here in our office, next clinic is September 24th).    Please inform us of any Emergency Department visits, hospitalizations, or changes in symptoms. Call us before going to the ED for breathing or allergy symptoms since we might be able to fit you in for a sick visit. Feel free to contact us anytime with any questions, problems, or concerns.  It was a pleasure to see you again today!  Websites that have reliable patient information: 1. American Academy of Asthma, Allergy, and Immunology: www.aaaai.org 2. Food Allergy Research and Education (FARE): foodallergy.org 3. Mothers of Asthmatics: http://www.asthmacommunitynetwork.org 4. American College of Allergy, Asthma, and Immunology: www.acaai.org   COVID-19 Vaccine Information can be  found at: PodExchange.nl For questions related to vaccine distribution or appointments, please email [email or web address] or call [phone].     "Like" [name] on Facebook and Instagram for our latest updates!        Make sure you are registered to vote! If you have moved or changed any of your contact information, you will need to get this updated before voting!  In some cases, you MAY be able to register to vote online: [email or web address]

## 2019-11-27 NOTE — Progress Notes (Signed)
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 Moderate persistent asthma without complication - Lung testing looks great today. - Stop the Breo and Spiriva and start Trelegy once puff once daily instead. - This contains all of the medications that you are getting, but just in one inhaler.  - Daily controller medication(s): Trelegy 200/62.5/25 one puff once daily - Prior to physical activity: albuterol 2 puffs 10-15 minutes before physical activity. - Rescue medications: albuterol 4 puffs every 4-6 hours as needed - Asthma control goals:  * Full participation in all desired activities (may need albuterol before activity) * Albuterol use two time or less a week on average (not counting use with activity) * Cough interfering with sleep two time or less a month * Oral steroids no more than once a year * No hospitalizations   2. Anaphylactic shock due to food (shellfish) - We are going to check on your allergy levels. - EpiPen refilled.   3. Seasonal and perennial allergic rhinitis - Continue with the as needed antihistamine. - We are going to recheck your environmental allergy levels.   4. Return in about 6 months (around 05/26/2020). We are offering the COVID19 shot [employer] and [place] and [place]) here in our office, next clinic is September 24th).   Subjective:   Jeffrey Olson is a 19 y.o. male presenting today for follow up of  Chief Complaint  Patient presents with  . Asthma    when he was using breo and spiriva his asthma was doing well. his insurance does not cover the spiriva.     Jeffrey Olson has a  history of the following: Patient Active Problem List   Diagnosis Date Noted  . Pollen-food allergy 08/08/2019  . Adverse reaction to food, subsequent encounter 08/08/2019  . COVID-19 virus infection 03/05/2019  . Not well controlled severe persistent asthma 04/06/2018  . Other allergic rhinitis 04/06/2018  . Non compliance w medication regimen 08/23/2016  . Seasonal and perennial allergic rhinitis 02/26/2016  . Migraine with aura and without status migrainosus, not intractable 06/02/2012    History obtained from: chart review and patient.  Jeffrey Olson is a 19 y.o. male presenting for a follow up visit. He was last seen in May 2021 by Dr. Selena Batten. At that time, he presented for an asthma exacerbation. He was given prednisone and continued on Breo and Spiriva was added as well. He was previously doing well on Xolair every two weeks, but he was unable to make the follow up appointments for the Xolair injections. He has a history of P/SAR and he had remained stable. He was continued on OTC antihistamines as needed. Repeat environmental allergy testing via the blood was performed. Continued avoidance of shellfish was recommended and his epinephrine was refilled.  Since the last visit, he has done fairly well.  He is here today for medication refills. th  Asthma/Respiratory Symptom History: He remains on the [facility] as well as the Spiriva. He has not been using his rescue inhaleer much at all. Jeffrey Olson's asthma has been well controlled. He has not required rescue medication, experienced nocturnal awakenings due to lower respiratory symptoms, nor have activities of daily living been limited.  He has required no Emergency Department or Urgent Care visits for his asthma. He has required zero courses of systemic steroids for asthma exacerbations since the last visit. ACT score today is 20, indicating excellent asthma symptom control.   Allergic Rhinitis Symptom History: Testing was done in 2015 and was positive to  grass, weed, ragweed, trees, mold, dust mite, cat, dog, cockroach. He remains on antihistamines as needed. He has not needed antibiotics at all since the last visit.   Food Allergy Symptom History: He avoids all shellfish to be on the safe side. He is open to repeat shellfish testing. He does need a new EpiPen.   He is attending community college and getting some prerequisites done. 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   Otherwise, there have been no changes to his past medical history, surgical history, family history, or social history.    Review of Systems  Constitutional: Negative.  Negative for chills, fever, malaise/fatigue and weight loss.  HENT: Positive for congestion. Negative for ear discharge, ear pain and sinus pain.   Eyes: Negative for pain, discharge and redness.  Respiratory: Negative for cough, sputum production, shortness of breath and wheezing.   Cardiovascular: Negative.  Negative for chest pain and palpitations.  Gastrointestinal: Negative for abdominal pain, constipation, diarrhea, heartburn, nausea and vomiting.  Skin: Negative.  Negative for itching and rash.  Neurological: Negative for dizziness and headaches.  Endo/Heme/Allergies: Positive for environmental allergies. Does not bruise/bleed easily.       Objective:   Blood pressure 124/76, pulse 86, temperature 98.4 F (36.9 C), temperature source Temporal, resp. rate 18, height 5\' 9"  (1.753 m), weight 225 lb (102.1 kg), SpO2 97 %. Body mass index is 33.23 kg/m.   Physical Exam:  Physical Exam Constitutional:      Appearance: He is well-developed.     Comments: Pleasant male. Talkative.   HENT:     Head: Normocephalic and atraumatic.     Right Ear: Tympanic membrane, ear canal and external ear normal.     Left Ear: Tympanic membrane, ear canal and external ear normal.     Nose: No nasal deformity, septal deviation, mucosal  edema or rhinorrhea.     Right Turbinates: Enlarged and swollen.     Left Turbinates: Enlarged and swollen.     Right Sinus: No maxillary sinus tenderness or frontal sinus tenderness.     Left Sinus: No maxillary sinus tenderness or frontal sinus tenderness.     Mouth/Throat:     Mouth: Mucous membranes are not pale and not dry.     Pharynx: Uvula midline.  Eyes:     General:        Right eye: No discharge.        Left eye: No discharge.     Conjunctiva/sclera: Conjunctivae normal.     Right eye: Right conjunctiva is not injected. No chemosis.    Left eye: Left conjunctiva is not injected. No chemosis.    Pupils: Pupils are equal, round, and reactive to light.  Cardiovascular:     Rate and Rhythm: Normal rate and regular rhythm.     Heart sounds: Normal heart sounds.  Pulmonary:     Effort: Pulmonary effort is normal. No tachypnea, accessory muscle usage or respiratory distress.     Breath sounds: Normal breath sounds. No wheezing, rhonchi or rales.     Comments: Moving air well in all lung fields.  No increased work of breathing noted.  Chest:     Chest wall: No tenderness.  Lymphadenopathy:     Cervical: No cervical adenopathy.  Skin:    Coloration: Skin is not pale.     Findings: No abrasion, erythema, petechiae or rash. Rash is not papular, urticarial or vesicular.     Comments: No eczematous or urticarial lesions noted.  Neurological:     Mental Status: He is alert.  Psychiatric:        Behavior: Behavior is cooperative.      Diagnostic studies: none     Malachi Bonds, MD  [facility]

## 2019-11-28 ENCOUNTER — Encounter: Payer: Self-pay | Admitting: Allergy & Immunology

## 2019-11-30 ENCOUNTER — Telehealth: Payer: Self-pay | Admitting: Allergy & Immunology

## 2019-11-30 LAB — ALLERGEN PROFILE, SHELLFISH
Clam IgE: <0.1 kU/L
F023-IgE Crab: 1.18 kU/L — AB
F080-IgE Lobster: 0.62 kU/L — AB
F290-IgE Oyster: <0.1 kU/L
Scallop IgE: 0.2 kU/L — AB
Shrimp IgE: 1.88 kU/L — AB

## 2019-11-30 LAB — ALLERGENS W/TOTAL IGE AREA 2
Alternaria Alternata IgE: 5.28 kU/L — AB
Aspergillus Fumigatus IgE: <0.1 kU/L
Bermuda Grass IgE: 0.61 kU/L — AB
Cat Dander IgE: 16.1 kU/L — AB
Cedar, Mountain IgE: 0.11 kU/L — AB
Cladosporium Herbarum IgE: <0.1 kU/L
Cockroach, German IgE: 1.08 kU/L — AB
Common Silver Birch IgE: 80.2 kU/L — AB
Cottonwood IgE: 0.22 kU/L — AB
D Farinae IgE: 13.8 kU/L — AB
D Pteronyssinus IgE: 13.9 kU/L — AB
Dog Dander IgE: 26 kU/L — AB
Elm, American IgE: 0.75 kU/L — AB
IgE (Immunoglobulin E), Serum: 847 IU/mL — ABNORMAL HIGH (ref 6–495)
Johnson Grass IgE: <0.1 kU/L
Maple/Box Elder IgE: 0.22 kU/L — AB
Mouse Urine IgE: <0.1 kU/L
Oak, White IgE: 96.5 kU/L — AB
Pecan, Hickory IgE: 0.57 kU/L — AB
Penicillium Chrysogen IgE: <0.1 kU/L
Pigweed, Rough IgE: 0.16 kU/L — AB
Ragweed, Short IgE: 0.87 kU/L — AB
Sheep Sorrel IgE Qn: 0.13 kU/L — AB
Timothy Grass IgE: <0.1 kU/L
White Mulberry IgE: <0.1 kU/L

## 2019-11-30 LAB — CBC WITH DIFFERENTIAL/PLATELET
Basophils Absolute: 0.1 10*3/uL (ref 0.0–0.2)
Basos: 1 %
EOS (ABSOLUTE): 0.1 10*3/uL (ref 0.0–0.4)
Eos: 1 %
Hematocrit: 46.8 % (ref 37.5–51.0)
Hemoglobin: 15.6 g/dL (ref 13.0–17.7)
Immature Grans (Abs): 0 10*3/uL (ref 0.0–0.1)
Immature Granulocytes: 0 %
Lymphocytes Absolute: 1.6 10*3/uL (ref 0.7–3.1)
Lymphs: 21 %
MCH: 30.3 pg (ref 26.6–33.0)
MCHC: 33.3 g/dL (ref 31.5–35.7)
MCV: 91 fL (ref 79–97)
Monocytes Absolute: 0.9 10*3/uL (ref 0.1–0.9)
Monocytes: 12 %
Neutrophils Absolute: 4.9 10*3/uL (ref 1.4–7.0)
Neutrophils: 65 %
Platelets: 359 10*3/uL (ref 150–450)
RBC: 5.15 x10E6/uL (ref 4.14–5.80)
RDW: 12 % (ref 11.6–15.4)
WBC: 7.6 10*3/uL (ref 3.4–10.8)

## 2019-11-30 NOTE — Telephone Encounter (Signed)
Patient called returning a phone call. I told patient it was for his lab results and patient hung up.

## 2019-12-06 ENCOUNTER — Telehealth: Payer: Self-pay | Admitting: *Deleted

## 2019-12-06 NOTE — Telephone Encounter (Signed)
-----   Message from Alfonse Spruce, MD sent at 12/05/2019  4:27 PM EDT ----- Regarding: RE: Xolair Sounds good - I am guessing we can just restart the previous dose?   Malachi Bonds, MD [facility] ----- Message ----- From: Ma Hillock, CMA Sent: 12/05/2019   4:26 PM EDT To: Alfonse Spruce, MD, # Subject: Herold Harms patient and reviewed lab results. Patient is interested in Xolair and needs re-approval. Please advise.

## 2019-12-06 NOTE — Telephone Encounter (Signed)
Called patient and advised approval and submit to [facility] specialty pharmacy. Will reach out when delivery date set to advise to make appt to restart

## 2020-03-18 ENCOUNTER — Telehealth: Payer: Self-pay | Admitting: Allergy & Immunology

## 2020-03-18 MED ORDER — ALBUTEROL SULFATE HFA 108 (90 BASE) MCG/ACT IN AERS: 2.0000 | INHALATION_SPRAY | RESPIRATORY_TRACT | 0 refills | Status: DC | PRN

## 2020-03-18 NOTE — Telephone Encounter (Signed)
Spoke with patient and informed him that a refill has been sent to the requested pharmacy.

## 2020-03-18 NOTE — Telephone Encounter (Signed)
PT mom calling to get albuterol inhaler sent to [facility] on [address]

## 2020-04-10 ENCOUNTER — Other Ambulatory Visit: Payer: Self-pay | Admitting: Allergy & Immunology

## 2020-04-10 NOTE — Telephone Encounter (Signed)
Script has been sent in and patient made aware.

## 2020-04-10 NOTE — Telephone Encounter (Signed)
Patient needs a refill on albuterol inhaler because he lost his other one. Patient also needs a refill on duoneb solution. Patient states he would like to have a preventative inhaler, he thought maybe it was ventolin?  Please advise.

## 2020-05-27 ENCOUNTER — Ambulatory Visit: Payer: Medicaid Other | Admitting: Allergy & Immunology

## 2020-06-20 NOTE — Patient Instructions (Incomplete)
1. Moderate persistent asthma without complication - Daily controller medication(s): Trelegy 200/62.5/25 one puff once daily - Prior to physical activity: albuterol 2 puffs 10-15 minutes before physical activity. - Rescue medications: albuterol 4 puffs every 4-6 hours as needed - Asthma control goals:  * Full participation in all desired activities (may need albuterol before activity) * Albuterol use two time or less a week on average (not counting use with activity) * Cough interfering with sleep two time or less a month * Oral steroids no more than once a year * No hospitalizations  2. Anaphylactic shock due to food (shellfish) Avoid shellfish. In case of an allergic reaction, give Benadryl *** {Blank single:19197::"teaspoonful","teaspoonfuls","capsules"} every {blank single:19197::"4","6"} hours, and if life-threatening symptoms occur, inject with EpiPen 0.3 mg.   3. Seasonal and perennial allergic rhinitis ( dust mite, cat, dog, grass, cock roach, mokd, tree, ragweed, weeds) - Continue with the as needed antihistamine.

## 2020-06-23 ENCOUNTER — Ambulatory Visit: Payer: Medicaid Other | Admitting: Family

## 2020-07-31 ENCOUNTER — Other Ambulatory Visit: Payer: Self-pay | Admitting: Allergy & Immunology

## 2020-07-31 NOTE — Telephone Encounter (Signed)
Attempted to call patient to inform patient that he needs an appointment in order to refill his ProAir inhaler.  Patient has had 2 no shows 05/27/2020 and 06/23/2020.  Unable to leave a voicemail due to voicemail not set up and home phone was not in service.

## 2020-09-30 ENCOUNTER — Other Ambulatory Visit: Payer: Self-pay | Admitting: Allergy

## 2020-11-24 ENCOUNTER — Other Ambulatory Visit: Payer: Self-pay | Admitting: Allergy & Immunology

## 2020-11-24 ENCOUNTER — Other Ambulatory Visit: Payer: Self-pay

## 2020-11-24 ENCOUNTER — Encounter: Payer: Self-pay | Admitting: Family

## 2020-11-24 ENCOUNTER — Ambulatory Visit: Payer: Medicaid Other | Admitting: Family

## 2020-11-24 VITALS — BP 126/82 | HR 72 | Temp 98.1°F | Resp 18 | Ht 69.0 in | Wt 223.8 lb

## 2020-11-24 DIAGNOSIS — J3089 Other allergic rhinitis: Secondary | ICD-10-CM

## 2020-11-24 DIAGNOSIS — J302 Other seasonal allergic rhinitis: Secondary | ICD-10-CM

## 2020-11-24 DIAGNOSIS — J4550 Severe persistent asthma, uncomplicated: Secondary | ICD-10-CM | POA: Diagnosis not present

## 2020-11-24 DIAGNOSIS — T781XXD Other adverse food reactions, not elsewhere classified, subsequent encounter: Secondary | ICD-10-CM

## 2020-11-24 DIAGNOSIS — K219 Gastro-esophageal reflux disease without esophagitis: Secondary | ICD-10-CM | POA: Diagnosis not present

## 2020-11-24 DIAGNOSIS — H1013 Acute atopic conjunctivitis, bilateral: Secondary | ICD-10-CM

## 2020-11-24 DIAGNOSIS — H1010 Acute atopic conjunctivitis, unspecified eye: Secondary | ICD-10-CM

## 2020-11-24 MED ORDER — PANTOPRAZOLE SODIUM 40 MG PO TBEC: 40.0000 mg | DELAYED_RELEASE_TABLET | Freq: Every day | ORAL | 1 refills | Status: DC

## 2020-11-24 MED ORDER — EPINEPHRINE 0.3 MG/0.3ML IJ SOAJ: 0.3000 mg | INTRAMUSCULAR | 1 refills | Status: DC | PRN

## 2020-11-24 MED ORDER — TRELEGY ELLIPTA 200-62.5-25 MCG/INH IN AEPB: INHALATION_SPRAY | RESPIRATORY_TRACT | 5 refills | Status: DC

## 2020-11-24 MED ORDER — CROMOLYN SODIUM 4 % OP SOLN: OPHTHALMIC | 3 refills | Status: DC

## 2020-11-24 NOTE — Patient Instructions (Addendum)
1. Severe persistent asthma without complication We will send a message to Tammy, our biologics coordinator, about getting re-approval for Xolair. She will be in contact with you. - Daily controller medication(s): Trelegy 200/62.5/25 one puff once daily - Prior to physical activity: albuterol 2 puffs 10-15 minutes before physical activity. - Rescue medications: albuterol 4 puffs every 4-6 hours as needed - Asthma control goals:  * Full participation in all desired activities (may need albuterol before activity) * Albuterol use two time or less a week on average (not counting use with activity) * Cough interfering with sleep two time or less a month * Oral steroids no more than once a year * No hospitalizations  2. Anaphylactic shock due to food ( reports that he is able to eat shellfish with out any problems) -. Avoid fruits. In case of an allergic reaction, give Benadryl 4 teaspoonfuls every 4 hours, and if life-threatening symptoms occur, inject with EpiPen 0.3 mg.    3. Seasonal and perennial allergic rhinitis - you may use an over the counter antihistamine such as Claritin (loratadine), Xyzal (levocetirizine), Zyrtec (cetirizine), or Allegra (fexofenadine) once a day as needed for runny nose  4.  Allergic conjunctivitis -Cromolyn 4% ophthalmic drops 1 drop each eye up to 4 times a day as needed for itchy watery eyes  5. Pollen food allergy - The oral allergy syndrome (OAS) or pollen-food allergy syndrome (PFAS) is a relatively common form of food allergy, particularly in adults. It typically occurs in people who have pollen allergies when the immune system "sees" proteins on the food that look like proteins on the pollen. This results in the allergy antibody (IgE) binding to the food instead of the pollen. Patients typically report itching and/or mild swelling of the mouth and throat immediately following ingestion of certain uncooked fruits (including nuts) or raw vegetables. Only a very  small number of affected individuals experience systemic allergic reactions, such as anaphylaxis which occurs with true food allergies.   6. Reflux  Re-start pantoprazole 40 mg once a day  Start dietary and lifestyle modifications as listed below  Lifestyle Changes for Controlling GERD When you have GERD, stomach acid feels as if it's backing up toward your mouth. Whether or not you take medication to control your GERD, your symptoms can often be improved with lifestyle changes.   Raise Your Head Reflux is more likely to strike when you're lying down flat, because stomach fluid can flow backward more easily. Raising the head of your bed 4-6 inches can help. To do this: Slide blocks or books under the legs at the head of your bed. Or, place a wedge under the mattress. Many foam stores can make a suitable wedge for you. The wedge should run from your waist to the top of your head. Don't just prop your head on several pillows. This increases pressure on your stomach. It can make GERD worse.  Watch Your Eating Habits Certain foods may increase the acid in your stomach or relax the lower esophageal sphincter, making GERD more likely. It's best to avoid the following: Coffee, tea, and carbonated drinks (with and without caffeine) Fatty, fried, or spicy food Mint, chocolate, onions, and tomatoes Any other foods that seem to irritate your stomach or cause you pain  Relieve the Pressure Eat smaller meals, even if you have to eat more often. Don't lie down right after you eat. Wait a few hours for your stomach to empty. Avoid tight belts and tight-fitting clothes. Lose excess  weight.  Tobacco and Alcohol Avoid smoking tobacco and drinking alcohol. They can make GERD symptoms worse.  Please let us know if this treatment plan is not working well for you Schedule a follow-up appointment in 6 weeks or sooner if needed

## 2020-11-24 NOTE — Progress Notes (Signed)
[address] Deb[place]a [place] [address] Dept: [phone]  FOLLOW UP NOTE  Patient ID: Jeffrey Olson, male    DOB: 25-Apr-2000  Age: 20 y.o. MRN: [id number] Date of Office Visit: 11/24/2020  Assessment  Chief Complaint: Asthma  HPI Jeffrey Olson is a 20 year old male who presents today for follow-up of moderate persistent asthma without complication, anaphylactic shock due to food, and seasonal and perennial allergic rhinitis.  He was last seen on November 27, 2019 by Dr. Dellis Anes.  Moderate persistent asthma is reported as doing better since he stopped smoking marijuana 2 months ago.  He is currently out of Trelegy 200 mcg 1 puff once a day.  He has been out for the past 2 to 3 days.  He still reports a little productive cough with clear sputum, wheezing, tightness in his chest, shortness of breath, and occasional nocturnal awakenings due to breathing problems.  Since his last office visit he has not required any systemic steroids or made any trips to the emergency room or urgent care due to breathing problems.  He uses his albuterol inhaler approximately 2-4 times a day.  He is interested in restarting Xolair injections.  He reports that when he was on Xolair in the past that his breathing was better.  He reports that he is able to eat shellfish now without any problems.  He recently went to a restaurant called [employer] and ate crab, shrimp, crawfish, mussels, clams, and lobster without any issues.  He does report that he cannot eat certain fruits like apples and pears.  They cause his mouth to burn and itch.  He is able to eat apples that have been baked.  Since his last office visit he has not had to use his EpiPen.  Seasonal and perennial allergic rhinitis is reported as moderately controlled with no medications at this time.  He reports occasional rhinorrhea and nasal congestion and occasional postnasal drip.  He also reports itchy watery eyes.  He reports heartburn and reflux  symptoms almost every other day.  He is not taking any medications for this and will try to drink milk when he has the symptoms.  He likes to eat spicy foods.   Drug Allergies:  Allergies  Allergen Reactions   Apple Swelling    Oral swelling    Peach [Prunus Persica] Swelling   Plum Pulp Swelling   Shellfish Allergy Other (See Comments)    Allergy testing     Review of Systems: Review of Systems  Constitutional:  Negative for chills and fever.  HENT:         Reports occasional rhinorrhea, nasal congestion, and postnasal drip  Eyes:        Reports occasional itchy watery eyes  Respiratory:  Positive for cough, shortness of breath and wheezing.        Reports a productive cough with clear sputum, tightness in his chest, shortness of breath, and wheezing.  Cardiovascular:  Negative for chest pain and palpitations.  Gastrointestinal:  Positive for heartburn.       Reports heartburn and reflux symptoms almost every day  Skin:  Negative for itching and rash.  Neurological:  Positive for headaches.       Reports history of migraines  Endo/Heme/Allergies:  Positive for environmental allergies.    Physical Exam: BP 126/82   Pulse 72   Temp 98.1 F (36.7 C)   Resp 18   Ht 5\' 9"  (1.753 m)   Wt 223 lb 12.8  oz (101.5 kg)   SpO2 97%   BMI 33.05 kg/m    Physical Exam Constitutional:      Appearance: Normal appearance.  HENT:     Head: Normocephalic and atraumatic.     Comments: Pharynx normal, eyes normal, ears normal, nose: Bilateral lower turbinates mildly edematous and slightly erythematous with clear drainage noted    Right Ear: Tympanic membrane, ear canal and external ear normal.     Left Ear: Tympanic membrane, ear canal and external ear normal.     Mouth/Throat:     Mouth: Mucous membranes are moist.     Pharynx: Oropharynx is clear.  Eyes:     Conjunctiva/sclera: Conjunctivae normal.  Cardiovascular:     Rate and Rhythm: Regular rhythm.     Heart sounds: Normal  heart sounds.  Pulmonary:     Effort: Pulmonary effort is normal.     Breath sounds: Normal breath sounds.     Comments: Lungs clear to auscultation Musculoskeletal:     Cervical back: Neck supple.  Skin:    General: Skin is warm.  Neurological:     Mental Status: He is alert and oriented to person, place, and time.  Psychiatric:        Mood and Affect: Mood normal.        Behavior: Behavior normal.        Thought Content: Thought content normal.        Judgment: Judgment normal.    Diagnostics: FVC 5.03 L, FEV1 4.04 L.  Predicted FVC 4.92 L, predicted FEV1 4.21 L.  Spirometry indicates normal ventilatory function.  Assessment and Plan: 1. Not well controlled severe persistent asthma   2. Pollen-food allergy, subsequent encounter   3. Allergic conjunctivitis, unspecified laterality   4. Seasonal and perennial allergic rhinitis   5. Gastroesophageal reflux disease, unspecified whether esophagitis present     Meds ordered this encounter  Medications   pantoprazole (PROTONIX) 40 MG tablet    Sig: Take 1 tablet (40 mg total) by mouth daily.    Dispense:  90 tablet    Refill:  1   EPINEPHrine 0.3 mg/0.3 mL IJ SOAJ injection    Sig: Inject 0.3 mg into the muscle as needed for anaphylaxis.    Dispense:  2 each    Refill:  1    May dispense generic [employer] brand.   Fluticasone-Umeclidin-Vilant (TRELEGY ELLIPTA) 200-62.5-25 MCG/INH AEPB    Sig: Use 1 inhalation once a day to help prevent cough and wheeze    Dispense:  1 each    Refill:  5   cromolyn (OPTICROM) 4 % ophthalmic solution    Sig: Place 1 drop in each eye up to 4 times a day as needed for itchy watery eyes    Dispense:  10 mL    Refill:  3     Patient Instructions  1. Severe persistent asthma without complication We will send a message to Tammy, our biologics coordinator, about getting re-approval for Xolair. She will be in contact with you. - Daily controller medication(s): Trelegy 200/62.5/25 one puff once  daily - Prior to physical activity: albuterol 2 puffs 10-15 minutes before physical activity. - Rescue medications: albuterol 4 puffs every 4-6 hours as needed - Asthma control goals:  * Full participation in all desired activities (may need albuterol before activity) * Albuterol use two time or less a week on average (not counting use with activity) * Cough interfering with sleep two time or less a month *  Oral steroids no more than once a year * No hospitalizations  2. Anaphylactic shock due to food ( reports that he is able to eat shellfish with out any problems) -. Avoid fruits. In case of an allergic reaction, give Benadryl 4 teaspoonfuls every 4 hours, and if life-threatening symptoms occur, inject with EpiPen 0.3 mg.    3. Seasonal and perennial allergic rhinitis - you may use an over the counter antihistamine such as Claritin (loratadine), Xyzal (levocetirizine), Zyrtec (cetirizine), or Allegra (fexofenadine) once a day as needed for runny nose  4.  Allergic conjunctivitis -Cromolyn 4% ophthalmic drops 1 drop each eye up to 4 times a day as needed for itchy watery eyes  5. Pollen food allergy - The oral allergy syndrome (OAS) or pollen-food allergy syndrome (PFAS) is a relatively common form of food allergy, particularly in adults. It typically occurs in people who have pollen allergies when the immune system "sees" proteins on the food that look like proteins on the pollen. This results in the allergy antibody (IgE) binding to the food instead of the pollen. Patients typically report itching and/or mild swelling of the mouth and throat immediately following ingestion of certain uncooked fruits (including nuts) or raw vegetables. Only a very small number of affected individuals experience systemic allergic reactions, such as anaphylaxis which occurs with true food allergies.   6. Reflux  Re-start pantoprazole 40 mg once a day  Start dietary and lifestyle modifications as listed  below  Lifestyle Changes for Controlling GERD When you have GERD, stomach acid feels as if it's backing up toward your mouth. Whether or not you take medication to control your GERD, your symptoms can often be improved with lifestyle changes.   Raise Your Head Reflux is more likely to strike when you're lying down flat, because stomach fluid can flow backward more easily. Raising the head of your bed 4-6 inches can help. To do this: Slide blocks or books under the legs at the head of your bed. Or, place a wedge under the mattress. Many foam stores can make a suitable wedge for you. The wedge should run from your waist to the top of your head. Don't just prop your head on several pillows. This increases pressure on your stomach. It can make GERD worse.  Watch Your Eating Habits Certain foods may increase the acid in your stomach or relax the lower esophageal sphincter, making GERD more likely. It's best to avoid the following: Coffee, tea, and carbonated drinks (with and without caffeine) Fatty, fried, or spicy food Mint, chocolate, onions, and tomatoes Any other foods that seem to irritate your stomach or cause you pain  Relieve the Pressure Eat smaller meals, even if you have to eat more often. Don't lie down right after you eat. Wait a few hours for your stomach to empty. Avoid tight belts and tight-fitting clothes. Lose excess weight.  Tobacco and Alcohol Avoid smoking tobacco and drinking alcohol. They can make GERD symptoms worse.  Please let us know if this treatment plan is not working well for you Schedule a follow-up appointment in 6 weeks or sooner if needed    Return in about 6 weeks (around 01/05/2021), or if symptoms worsen or fail to improve.    Thank you for the opportunity to care for this patient.  Please do not hesitate to contact me with questions.  Nehemiah Settle, FNP [facility]

## 2020-11-25 ENCOUNTER — Telehealth: Payer: Self-pay | Admitting: *Deleted

## 2020-11-25 NOTE — Telephone Encounter (Signed)
-----   Message from Nehemiah Settle, FNP sent at 11/24/2020  4:51 PM EDT ----- Jeffrey Olson is interested in re-starting Xolair injections

## 2020-11-25 NOTE — Telephone Encounter (Signed)
Thank you :)

## 2020-11-25 NOTE — Telephone Encounter (Signed)
All phone numbers on patient account disconnected will try to send letter

## 2020-11-26 ENCOUNTER — Telehealth: Payer: Self-pay | Admitting: *Deleted

## 2020-11-26 NOTE — Telephone Encounter (Signed)
PA has been submitted through CoverMyMeds for Trelegy and is currently pending approval/denial. ID [id number], BIN [id number], GRP [id number], PCN [place].

## 2020-11-26 NOTE — Telephone Encounter (Signed)
Pa was approved for trelegy until 11/26/2021

## 2020-12-16 IMAGING — CT CT ABDOMEN AND PELVIS WITH CONTRAST
2 of 4 series · 16 of 46 positions shown, 18 images · IV contrast (ISOVUE)
Comparison: None.

CLINICAL DATA: Syncopal episode today. Loss of consciousness for 1
minutes. Nausea, vomiting, diarrhea, and abdominal pain. Bright red
blood in stool.

EXAM:
CT ABDOMEN AND PELVIS WITH CONTRAST
TECHNIQUE: Multidetector CT imaging of the abdomen and pelvis was performed
using the standard protocol following bolus administration of
intravenous contrast.
CONTRAST:  100mL MFICH2-8RR IOPAMIDOL (MFICH2-8RR) INJECTION 61%

[Series 2: axial st · axial · 0.68mm/px · z∈[+407,+832]mm · 13 of 95 slices shown, 15 images]
[im 5/95  soft-tissue]
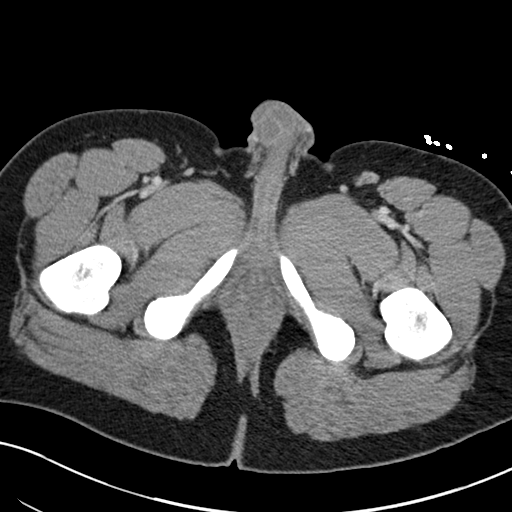
[im 5/95  bone]
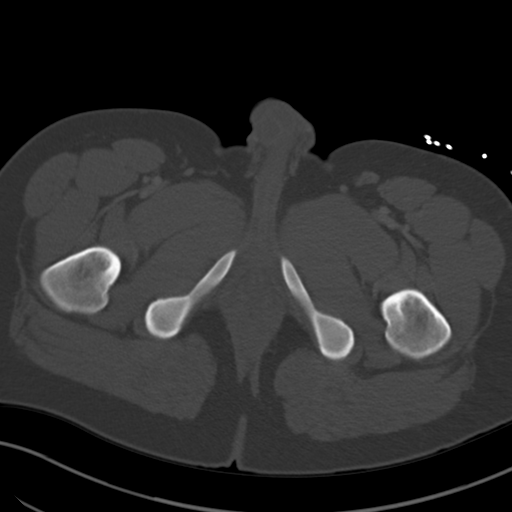
[im 15/95  soft-tissue]
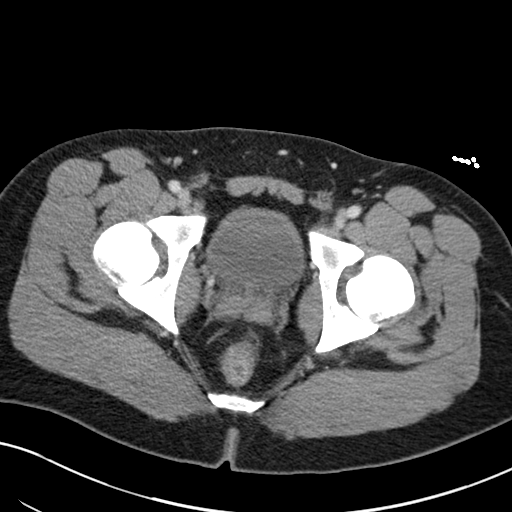
[im 19/95  soft-tissue]
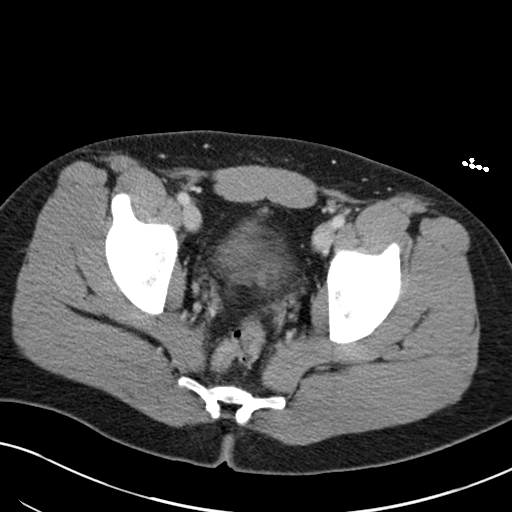
[im 29/95  soft-tissue]
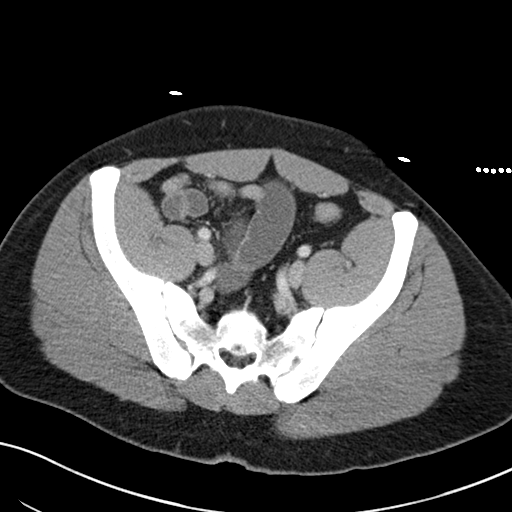
[im 33/95  soft-tissue]
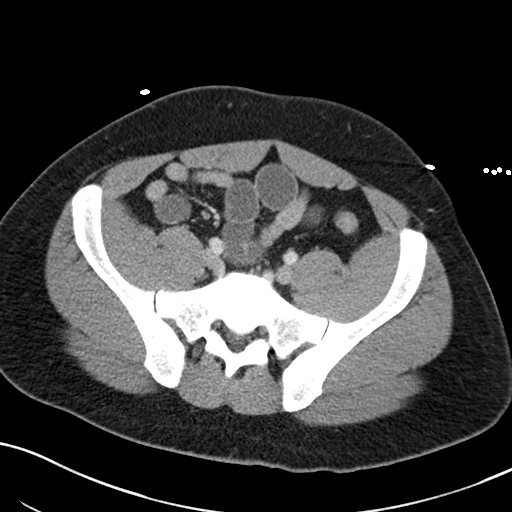
[im 43/95  soft-tissue]
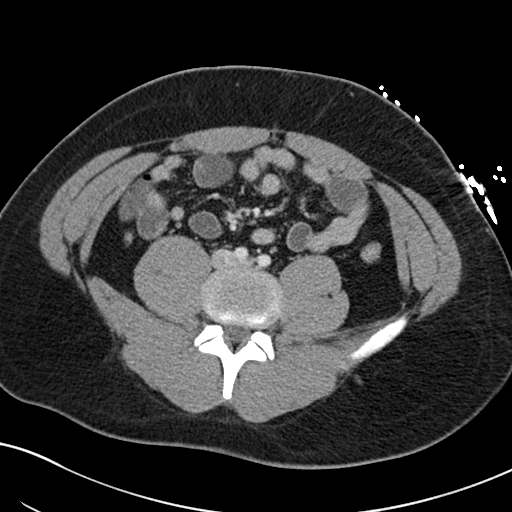
[im 48/95  soft-tissue]
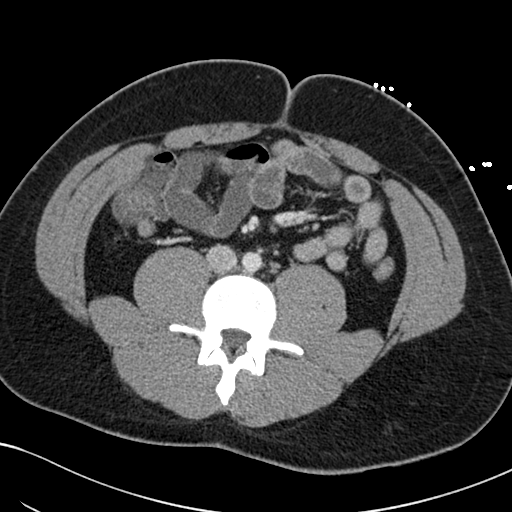
[im 52/95  soft-tissue]
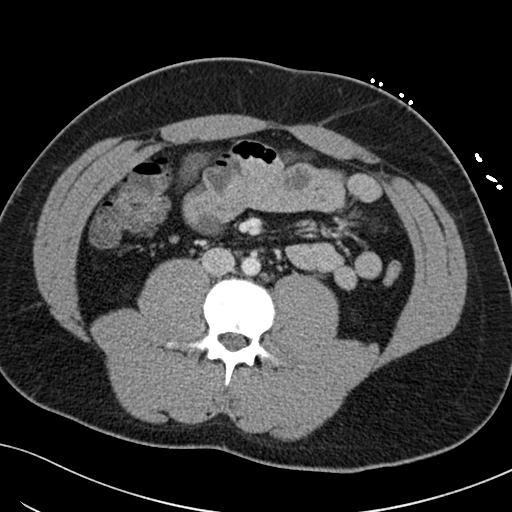
[im 62/95  soft-tissue]
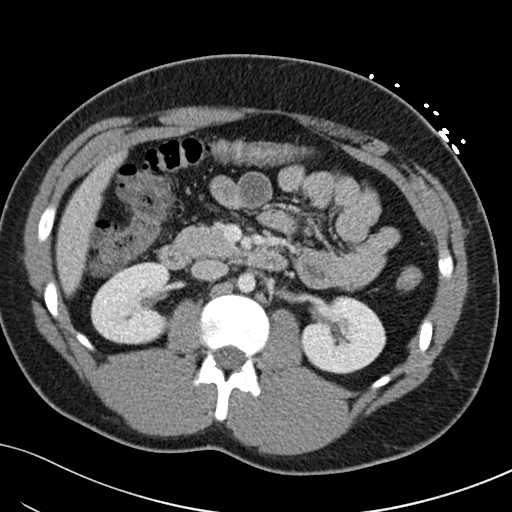
[im 62/95  bone]
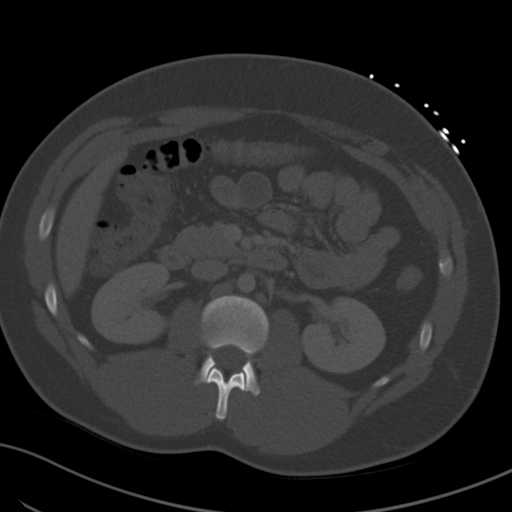
[im 66/95  soft-tissue]
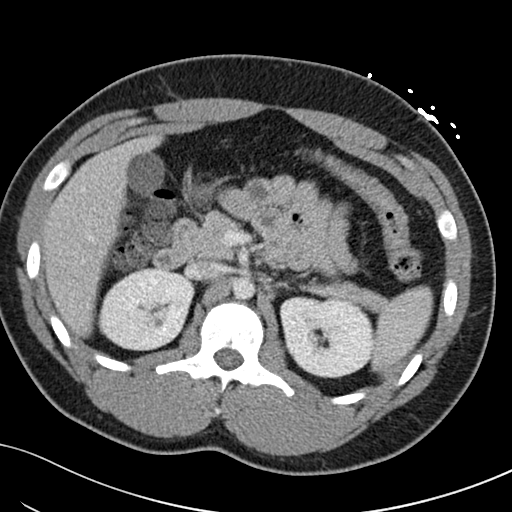
[im 76/95  soft-tissue]
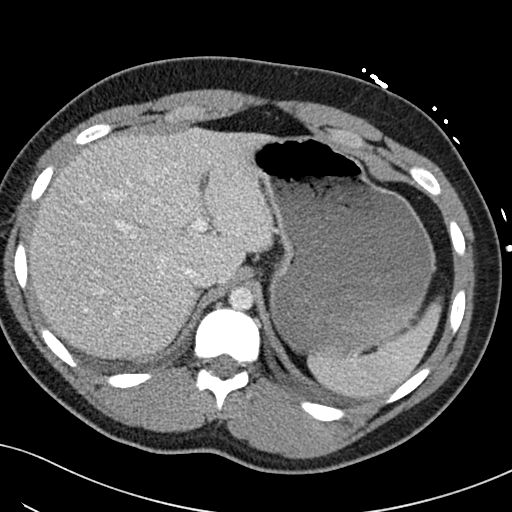
[im 80/95  soft-tissue]
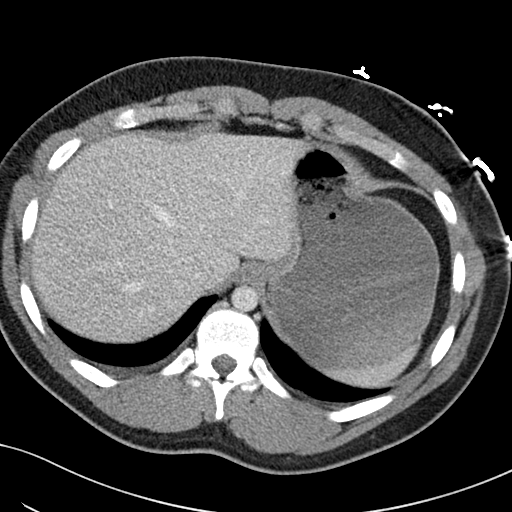
[im 90/95  soft-tissue]
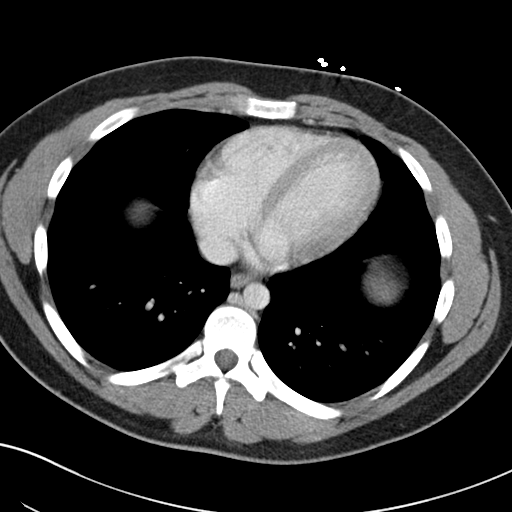

[Series 5: coronal st · coronal · 0.72mm/px · 3 of 119 slices shown]
[im 40/119  soft-tissue]
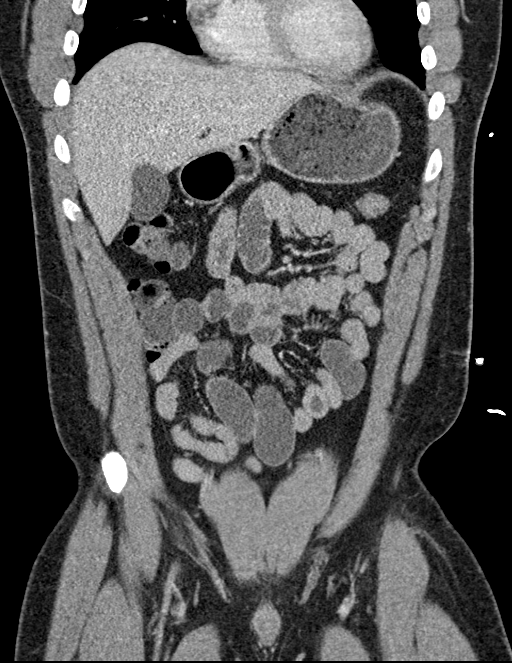
[im 53/119  soft-tissue]
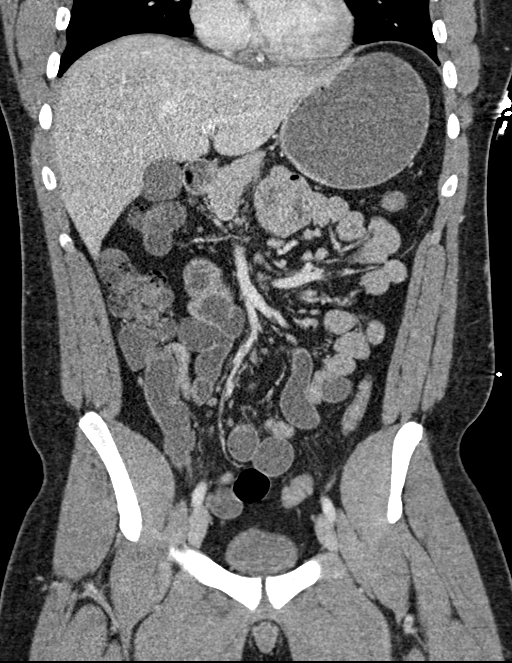
[im 66/119  soft-tissue]
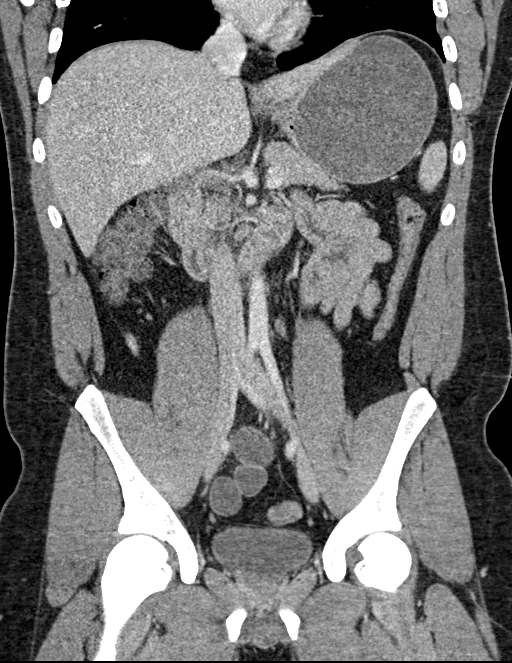

[16 of 46 positions shown; findings below may reference images not displayed]

FINDINGS: Lower chest: Lung bases are clear.

Hepatobiliary: No focal liver abnormality is seen. No gallstones,
gallbladder wall thickening, or biliary dilatation.

Pancreas: Unremarkable. No pancreatic ductal dilatation or
surrounding inflammatory changes.

Spleen: Normal in size without focal abnormality.

Adrenals/Urinary Tract: Adrenal glands are unremarkable. Kidneys are
normal, without renal calculi, focal lesion, or hydronephrosis.
Bladder is unremarkable.

Stomach/Bowel: Stomach is within normal limits. Appendix appears
normal. No evidence of bowel wall thickening, distention, or
inflammatory changes.

Vascular/Lymphatic: No significant vascular findings are present. No
enlarged abdominal or pelvic lymph nodes.

Reproductive: Prostate is unremarkable.

Other: No abdominal wall hernia or abnormality. No abdominopelvic
ascites.

Musculoskeletal: No acute or significant osseous findings.
IMPRESSION: No acute process demonstrated in the abdomen or pelvis. No evidence
of bowel obstruction or inflammation. Appendix is normal.

## 2020-12-23 NOTE — Telephone Encounter (Signed)
Letter came back unable to deliver

## 2021-01-26 ENCOUNTER — Telehealth: Payer: Self-pay | Admitting: Family

## 2021-01-26 MED ORDER — VENTOLIN HFA 108 (90 BASE) MCG/ACT IN AERS: 2.0000 | INHALATION_SPRAY | RESPIRATORY_TRACT | 1 refills | Status: DC | PRN

## 2021-01-26 NOTE — Telephone Encounter (Signed)
Jeffrey Olson called in and states that when he went to the pharmacy to pick up his Pro-Air, the pharmacy told him they didn't have it.  He doesn't remember if they told him they do not have make Pro-Air anymore or if his insurance doesn't cover it.  Jeffrey Olson states he really needs this medication because he has been without it now for 3 days.  Please advise.

## 2021-01-26 NOTE — Telephone Encounter (Signed)
Called the pharmacy, they informed me that proair was taken off the market and Ventolin is now preferred. New Rx has been sent in and patient made aware. Patient appreciated me reaching out to the pharmacy in regards to this matter.

## 2021-01-26 NOTE — Telephone Encounter (Signed)
Called patient however number has been changed, Called home number and it was busy. Will try again later.

## 2021-02-22 NOTE — Patient Instructions (Addendum)
1. Severe persistent asthma (reports that Symbicort caused his breathing to feels worse) -Prednisone 10 mg taking 2 tablets twice a day for 3 days, then on the fourth day take 2 tablets in the morning, then on the fifth day take 1 tablet and stop -Recommend stop smoking marijuana -When you go up front checkout make sure that we have your correct address and phone numbers.  We have tried contacting about starting Xolair. -We will send a message to Tammy, our biologics coordinator, about getting re-approval for Xolair. She will be in contact with you. -Stop Trelegy 200 mcg as this is no longer covered - Daily controller medication(s): START Advair HFA 230/21 mcg using 2 puffs twice a day with spacer and  Start Spiriva Respimat 1.25 mcg 2 puffs once a day. Demonstration given. - Prior to physical activity: albuterol 2 puffs 10-15 minutes before physical activity. - Rescue medications: albuterol 4 puffs every 4-6 hours as needed - Asthma control goals:  * Full participation in all desired activities (may need albuterol before activity) * Albuterol use two time or less a week on average (not counting use with activity) * Cough interfering with sleep two time or less a month * Oral steroids no more than once a year * No hospitalizations  2. Anaphylactic shock due to food ( reports that he is able to eat shellfish with out any problems) -. Avoid fruits. In case of an allergic reaction, give Benadryl 4 teaspoonfuls every 4 hours, and if life-threatening symptoms occur, inject with EpiPen 0.3 mg.    3. Seasonal and perennial allergic rhinitis - you may use an over the counter antihistamine such as Claritin (loratadine), Xyzal (levocetirizine), Zyrtec (cetirizine), or Allegra (fexofenadine) once a day as needed for runny nose  4.  Allergic conjunctivitis -May use cromolyn 4% ophthalmic drops 1 drop each eye up to 4 times a day as needed for itchy watery eyes  5. Pollen food allergy - The oral  allergy syndrome (OAS) or pollen-food allergy syndrome (PFAS) is a relatively common form of food allergy, particularly in adults. It typically occurs in people who have pollen allergies when the immune system "sees" proteins on the food that look like proteins on the pollen. This results in the allergy antibody (IgE) binding to the food instead of the pollen. Patients typically report itching and/or mild swelling of the mouth and throat immediately following ingestion of certain uncooked fruits (including nuts) or raw vegetables. Only a very small number of affected individuals experience systemic allergic reactions, such as anaphylaxis which occurs with true food allergies.   6. Reflux  Continue pantoprazole 40 mg once a day .  Try using this more consistently to see if this helps.  If it does not get any better speak with your primary care physician Start dietary and lifestyle modifications as listed below  Lifestyle Changes for Controlling GERD When you have GERD, stomach acid feels as if it's backing up toward your mouth. Whether or not you take medication to control your GERD, your symptoms can often be improved with lifestyle changes.   Raise Your Head Reflux is more likely to strike when you're lying down flat, because stomach fluid can flow backward more easily. Raising the head of your bed 4-6 inches can help. To do this: Slide blocks or books under the legs at the head of your bed. Or, place a wedge under the mattress. Many foam stores can make a suitable wedge for you. The wedge should run from your  waist to the top of your head. Don't just prop your head on several pillows. This increases pressure on your stomach. It can make GERD worse.  Watch Your Eating Habits Certain foods may increase the acid in your stomach or relax the lower esophageal sphincter, making GERD more likely. It's best to avoid the following: Coffee, tea, and carbonated drinks (with and without  caffeine) Fatty, fried, or spicy food Mint, chocolate, onions, and tomatoes Any other foods that seem to irritate your stomach or cause you pain  Relieve the Pressure Eat smaller meals, even if you have to eat more often. Don't lie down right after you eat. Wait a few hours for your stomach to empty. Avoid tight belts and tight-fitting clothes. Lose excess weight.  Tobacco and Alcohol Avoid smoking tobacco and drinking alcohol. They can make GERD symptoms worse.  Please let us know if this treatment plan is not working well for you Schedule a follow-up appointment in 6 weeks or sooner if needed

## 2021-02-23 ENCOUNTER — Ambulatory Visit: Payer: Medicaid Other | Admitting: Family

## 2021-02-23 ENCOUNTER — Encounter: Payer: Self-pay | Admitting: Family

## 2021-02-23 ENCOUNTER — Other Ambulatory Visit: Payer: Self-pay

## 2021-02-23 VITALS — BP 124/78 | HR 84 | Temp 98.0°F | Resp 16 | Ht 69.0 in | Wt 242.6 lb

## 2021-02-23 DIAGNOSIS — H1013 Acute atopic conjunctivitis, bilateral: Secondary | ICD-10-CM | POA: Diagnosis not present

## 2021-02-23 DIAGNOSIS — K219 Gastro-esophageal reflux disease without esophagitis: Secondary | ICD-10-CM | POA: Diagnosis not present

## 2021-02-23 DIAGNOSIS — J3089 Other allergic rhinitis: Secondary | ICD-10-CM

## 2021-02-23 DIAGNOSIS — J4550 Severe persistent asthma, uncomplicated: Secondary | ICD-10-CM | POA: Diagnosis not present

## 2021-02-23 DIAGNOSIS — T781XXD Other adverse food reactions, not elsewhere classified, subsequent encounter: Secondary | ICD-10-CM

## 2021-02-23 DIAGNOSIS — T7800XD Anaphylactic reaction due to unspecified food, subsequent encounter: Secondary | ICD-10-CM

## 2021-02-23 DIAGNOSIS — H1010 Acute atopic conjunctivitis, unspecified eye: Secondary | ICD-10-CM

## 2021-02-23 DIAGNOSIS — J302 Other seasonal allergic rhinitis: Secondary | ICD-10-CM

## 2021-02-23 MED ORDER — SPIRIVA RESPIMAT 1.25 MCG/ACT IN AERS: 2.0000 | INHALATION_SPRAY | Freq: Every day | RESPIRATORY_TRACT | 5 refills | Status: DC

## 2021-02-23 MED ORDER — VENTOLIN HFA 108 (90 BASE) MCG/ACT IN AERS: 2.0000 | INHALATION_SPRAY | RESPIRATORY_TRACT | 1 refills | Status: DC | PRN

## 2021-02-23 MED ORDER — ADVAIR HFA 230-21 MCG/ACT IN AERO: 2.0000 | INHALATION_SPRAY | Freq: Two times a day (BID) | RESPIRATORY_TRACT | 5 refills | Status: DC

## 2021-02-23 NOTE — Progress Notes (Signed)
[address] [place] [address] Dept: [phone]  FOLLOW UP NOTE  Patient ID: Jeffrey Olson, male    DOB: February 24, 2001  Age: 20 y.o. MRN: [id number] Date of Office Visit: 02/23/2021  Assessment  Chief Complaint: Asthma  HPI Jeffrey Olson is a 20 year old male who presents today for follow-up of not well controlled severe persistent asthma, pollen food allergy, allergic conjunctivitis, seasonal and perennial allergic rhinitis, and gastroesophageal reflux disease.  He was last seen on November 24, 2020 by Nehemiah Settle, FNP.  Since his last office visit he denies any new diagnosis or surgeries.  Severe persistent asthma is reported as not well controlled with Trelegy 200 mcg 1 puff once a day and albuterol as needed.  He reports that he has been out of Trelegy for the past 2 weeks.  He reports that sometimes pharmacy fills the medication and sometimes it does not.  He reports that 1 pharmacist told him that Medicaid does not cover Trelegy.  He is interested in starting Xolair injections.  Instructed him to make sure that we have his up-to-date address and telephone numbers.  He reports occasional wheezing, occasional shortness of breath, occasional tightness in his chest, and nocturnal awakenings due to breathing problems.  He denies coughing, fever, or chills.  Since his last office visit he has not required any systemic steroids or made any trips to the emergency room or urgent care due to breathing problems.  He has been using his albuterol inhaler at least 1-2 times a day.  He mentions in the past when he has used Symbicort that it has made his breathing feel worse.  He reports that he smokes marijuana approximately 3 times a week.  He continues to avoid fruits without any accidental ingestion or use of his EpiPen.  He reports that his EpiPen is up-to-date.  Seasonal and perennial allergic rhinitis is reported as moderately controlled with no medications at this time.  He  reports clear rhinorrhea at times, nasal congestion at times, and postnasal drip.  He has not had any sinus infections since we last saw him.  He is not interested in trying an nose spray to help with his symptoms at this time.  Reflux is reported as not well controlled with pantoprazole 40 mg once a day when he remembers to take this medication.  He reports that he has a lot of reflux symptoms.  Instructed him to try taking his pantoprazole on a consistent basis.   Drug Allergies:  Allergies  Allergen Reactions   Apple Swelling    Oral swelling    Peach [Prunus Persica] Swelling   Plum Pulp Swelling   Shellfish Allergy Other (See Comments)    Allergy testing     Review of Systems: Review of Systems  Constitutional:  Negative for chills and fever.  HENT:         Reports clear rhinorrhea at times, nasal congestion at times, and postnasal drip at times.  Eyes:        Denies itchy watery eyes  Respiratory:  Positive for shortness of breath and wheezing. Negative for cough.        Reports occasional wheezing, occasional shortness of breath, occasional tightness in his chest, and nocturnal awakenings due to breathing problems.  He denies coughing.  Cardiovascular:  Negative for chest pain and palpitations.  Gastrointestinal:        Reports a lot of heartburn symptoms.  He takes pantoprazole when he remembers.  Genitourinary:  Negative  for frequency.  Skin:  Negative for itching and rash.  Neurological:  Negative for headaches.  Endo/Heme/Allergies:  Positive for environmental allergies.    Physical Exam: BP 124/78   Pulse 84   Temp 98 F (36.7 C)   Resp 16   Ht 5\' 9"  (1.753 m)   Wt 242 lb 9.6 oz (110 kg)   SpO2 98%   BMI 35.83 kg/m    Physical Exam Constitutional:      Appearance: Normal appearance.  HENT:     Head: Normocephalic and atraumatic.     Comments: Pharynx normal, eyes normal, ears normal, nose: Bilateral lower turbinates mildly edematous with no drainage  noted    Right Ear: Tympanic membrane, ear canal and external ear normal.     Left Ear: Tympanic membrane, ear canal and external ear normal.     Mouth/Throat:     Mouth: Mucous membranes are moist.     Pharynx: Oropharynx is clear.  Eyes:     Conjunctiva/sclera: Conjunctivae normal.  Cardiovascular:     Rate and Rhythm: Regular rhythm.     Heart sounds: Normal heart sounds.  Pulmonary:     Effort: Pulmonary effort is normal.     Breath sounds: Normal breath sounds.     Comments: Lungs clear to auscultation Skin:    General: Skin is warm.  Neurological:     Mental Status: He is alert and oriented to person, place, and time.  Psychiatric:        Mood and Affect: Mood normal.        Behavior: Behavior normal.        Thought Content: Thought content normal.        Judgment: Judgment normal.    Diagnostics: FVC 4.11 L, FEV1 3.04 L.  Predicted FVC 4.52 L.  Predicted FEV1 3.86 L.  Spirometry indicates possible mild obstruction.  Postbronchodilator response shows FVC 4.44 L, FEV1 3.08 L.  Spirometry indicates possible mild obstruction.  He reports that his breathing is better after the 4 puffs of Xopenex  Assessment and Plan: 1. Not well controlled severe persistent asthma   2. Anaphylactic shock due to food, subsequent encounter   3. Seasonal and perennial allergic rhinitis   4. Allergic conjunctivitis, unspecified laterality   5. Pollen-food allergy, subsequent encounter   6. Gastroesophageal reflux disease, unspecified whether esophagitis present     Meds ordered this encounter  Medications   fluticasone-salmeterol (ADVAIR HFA) 230-21 MCG/ACT inhaler    Sig: Inhale 2 puffs into the lungs 2 (two) times daily.    Dispense:  1 each    Refill:  5   Tiotropium Bromide Monohydrate (SPIRIVA RESPIMAT) 1.25 MCG/ACT AERS    Sig: Inhale 2 puffs into the lungs daily.    Dispense:  4 g    Refill:  5   VENTOLIN HFA 108 (90 Base) MCG/ACT inhaler    Sig: Inhale 2 puffs into the lungs  every 4 (four) hours as needed for wheezing or shortness of breath.    Dispense:  18 g    Refill:  1     Patient Instructions  1. Severe persistent asthma (reports that Symbicort caused his breathing to feels worse) -Prednisone 10 mg taking 2 tablets twice a day for 3 days, then on the fourth day take 2 tablets in the morning, then on the fifth day take 1 tablet and stop -Recommend stop smoking marijuana -When you go up front checkout make sure that we have your correct address and  phone numbers.  We have tried contacting about starting Xolair. -We will send a message to Tammy, our biologics coordinator, about getting re-approval for Xolair. She will be in contact with you. -Stop Trelegy 200 mcg as this is no longer covered - Daily controller medication(s): START Advair HFA 230/21 mcg using 2 puffs twice a day with spacer and  Start Spiriva Respimat 1.25 mcg 2 puffs once a day. Demonstration given. - Prior to physical activity: albuterol 2 puffs 10-15 minutes before physical activity. - Rescue medications: albuterol 4 puffs every 4-6 hours as needed - Asthma control goals:  * Full participation in all desired activities (may need albuterol before activity) * Albuterol use two time or less a week on average (not counting use with activity) * Cough interfering with sleep two time or less a month * Oral steroids no more than once a year * No hospitalizations  2. Anaphylactic shock due to food ( reports that he is able to eat shellfish with out any problems) -. Avoid fruits. In case of an allergic reaction, give Benadryl 4 teaspoonfuls every 4 hours, and if life-threatening symptoms occur, inject with EpiPen 0.3 mg.    3. Seasonal and perennial allergic rhinitis - you may use an over the counter antihistamine such as Claritin (loratadine), Xyzal (levocetirizine), Zyrtec (cetirizine), or Allegra (fexofenadine) once a day as needed for runny nose  4.  Allergic conjunctivitis -May use  cromolyn 4% ophthalmic drops 1 drop each eye up to 4 times a day as needed for itchy watery eyes  5. Pollen food allergy - The oral allergy syndrome (OAS) or pollen-food allergy syndrome (PFAS) is a relatively common form of food allergy, particularly in adults. It typically occurs in people who have pollen allergies when the immune system "sees" proteins on the food that look like proteins on the pollen. This results in the allergy antibody (IgE) binding to the food instead of the pollen. Patients typically report itching and/or mild swelling of the mouth and throat immediately following ingestion of certain uncooked fruits (including nuts) or raw vegetables. Only a very small number of affected individuals experience systemic allergic reactions, such as anaphylaxis which occurs with true food allergies.   6. Reflux  Continue pantoprazole 40 mg once a day .  Try using this more consistently to see if this helps.  If it does not get any better speak with your primary care physician Start dietary and lifestyle modifications as listed below  Lifestyle Changes for Controlling GERD When you have GERD, stomach acid feels as if it's backing up toward your mouth. Whether or not you take medication to control your GERD, your symptoms can often be improved with lifestyle changes.   Raise Your Head Reflux is more likely to strike when you're lying down flat, because stomach fluid can flow backward more easily. Raising the head of your bed 4-6 inches can help. To do this: Slide blocks or books under the legs at the head of your bed. Or, place a wedge under the mattress. Many foam stores can make a suitable wedge for you. The wedge should run from your waist to the top of your head. Don't just prop your head on several pillows. This increases pressure on your stomach. It can make GERD worse.  Watch Your Eating Habits Certain foods may increase the acid in your stomach or relax the lower  esophageal sphincter, making GERD more likely. It's best to avoid the following: Coffee, tea, and carbonated drinks (with and without  caffeine) Fatty, fried, or spicy food Mint, chocolate, onions, and tomatoes Any other foods that seem to irritate your stomach or cause you pain  Relieve the Pressure Eat smaller meals, even if you have to eat more often. Don't lie down right after you eat. Wait a few hours for your stomach to empty. Avoid tight belts and tight-fitting clothes. Lose excess weight.  Tobacco and Alcohol Avoid smoking tobacco and drinking alcohol. They can make GERD symptoms worse.  Please let us know if this treatment plan is not working well for you Schedule a follow-up appointment in 6 weeks or sooner if needed    Return in about 6 weeks (around 04/06/2021), or if symptoms worsen or fail to improve.    Thank you for the opportunity to care for this patient.  Please do not hesitate to contact me with questions.  Nehemiah Settle, FNP [facility]

## 2021-03-04 ENCOUNTER — Other Ambulatory Visit: Payer: Self-pay | Admitting: *Deleted

## 2021-03-04 ENCOUNTER — Telehealth: Payer: Self-pay | Admitting: Family

## 2021-03-04 MED ORDER — ALBUTEROL SULFATE (2.5 MG/3ML) 0.083% IN NEBU: 2.5000 mg | INHALATION_SOLUTION | RESPIRATORY_TRACT | 1 refills | Status: DC | PRN

## 2021-03-04 NOTE — Telephone Encounter (Signed)
Refilled medication for nebulizer medication. Called patient and informed of prescription, advised that I will check [facility] in the morning to ensure that there is a nebulizer there and will call him to inform. Patient verbalized understanding.

## 2021-03-04 NOTE — Telephone Encounter (Signed)
Ok to refill albuterol 0.083%for his nebulizer-using 1 unit dose every every 6 hours as needed for cough, wheeze, tightness in chest, or shortness of breath.  Ok for a new nebulizer. Do we have one in the [place] office?

## 2021-03-04 NOTE — Telephone Encounter (Signed)
Patient called stating he ws unable to pick up his nebulizer solution from the pharmacy. Patient was able to pick up his other medications. Patient stated it had been a while since he had gotten nebulizer solution.   [facility] - [address], [place] [address]   Patient also stated his nebulizer machine is old - he believes about 34-21 years old- and would like a new one.   Best contact number: [phone]

## 2021-03-05 ENCOUNTER — Telehealth: Payer: Self-pay | Admitting: *Deleted

## 2021-03-05 DIAGNOSIS — J45998 Other asthma: Secondary | ICD-10-CM | POA: Diagnosis not present

## 2021-03-05 DIAGNOSIS — J45909 Unspecified asthma, uncomplicated: Secondary | ICD-10-CM | POA: Diagnosis not present

## 2021-03-05 MED ORDER — XOLAIR 150 MG ~~LOC~~ SOLR: 375.0000 mg | SUBCUTANEOUS | 11 refills | Status: DC

## 2021-03-05 NOTE — Telephone Encounter (Signed)
-----   Message from Jeffrey Settle, FNP sent at 02/23/2021  5:03 PM EST ----- Patient wanting to start Xolair again. Instructed him to give [name] new contact information.

## 2021-03-05 NOTE — Telephone Encounter (Signed)
Patient called back to check on message from before. I did let patient know we had a nebulizer here in our [place] office. Patient stated he would come by to pick one up.   I have placed nebulizer up front for pick up.

## 2021-03-05 NOTE — Telephone Encounter (Signed)
Patient picked up nebulizer. I have placed [facility]'s phone in the nurse's station for provider to sign. Did advise patient form would need to be signed and mailed out to him.   Confirmed address and phone number on file to be correct.

## 2021-03-05 NOTE — Telephone Encounter (Signed)
Called patient and advised still has approval on file from where he didn't return calls to pharmacy last time. Will send new Rx and number given to patient to call them sometime next week to ok delivery for Xolair and will ler him know when delivery set.

## 2021-03-06 ENCOUNTER — Other Ambulatory Visit: Payer: Self-pay | Admitting: Family

## 2021-03-10 NOTE — Telephone Encounter (Signed)
Please let me know if I need to do anything.  Thank you, Nehemiah Settle, FNP

## 2021-07-23 ENCOUNTER — Other Ambulatory Visit: Payer: Self-pay | Admitting: Family

## 2021-07-26 NOTE — Telephone Encounter (Signed)
Ok to send courtesy refill. He needs to schedule a follow up appointment.

## 2021-08-19 ENCOUNTER — Other Ambulatory Visit: Payer: Self-pay | Admitting: Family

## 2021-10-22 ENCOUNTER — Other Ambulatory Visit: Payer: Self-pay | Admitting: Family

## 2022-01-29 ENCOUNTER — Other Ambulatory Visit: Payer: Self-pay | Admitting: Family

## 2022-01-30 ENCOUNTER — Other Ambulatory Visit: Payer: Self-pay | Admitting: Family

## 2022-02-01 NOTE — Telephone Encounter (Signed)
Patient called and made an appointment for 02/08/22. He is requesting refills for Ventolin and albuterol liquid for his nebulizer machine. I told him I was not sure if these could be filled since it has been a year since he was seen. I told him a nurse would call him if they had questions. I also told him he will need to keep his appointment.

## 2022-02-02 MED ORDER — ALBUTEROL SULFATE (2.5 MG/3ML) 0.083% IN NEBU: 2.5000 mg | INHALATION_SOLUTION | RESPIRATORY_TRACT | 1 refills | Status: DC | PRN

## 2022-02-07 NOTE — Patient Instructions (Addendum)
1. Severe persistent asthma (reports that Symbicort caused his breathing to feels worse) - Daily controller medication(s): Continue Advair HFA 230/21 mcg using 2 puffs twice a day with spacer. Make sure and use this every day. Set a reminder on your phone or set Advair next to your toothbrush to use prior to brushing your teeth in the morning and at night Start Spiriva Respimat 1.25 mcg 2 puffs once a day. This should be covered by your insurance. Let us know if you are not able to get this medication - Prior to physical activity: albuterol 2 puffs 10-15 minutes before physical activity. - Rescue medications: albuterol 4 puffs every 4-6 hours as needed - Asthma control goals:  * Full participation in all desired activities (may need albuterol before activity) * Albuterol use two time or less a week on average (not counting use with activity) * Cough interfering with sleep two time or less a month * Oral steroids no more than once a year * No hospitalizations  2. Anaphylactic shock due to food ( reports that he is able to eat shellfish with out any problems) -. Avoid fruits. In case of an allergic reaction, give Benadryl 4 teaspoonfuls every 4 hours, and if life-threatening symptoms occur, inject with EpiPen 0.3 mg. Refill sent    3. Seasonal and perennial allergic rhinitis - you may use an over the counter antihistamine such as Claritin (loratadine), Xyzal (levocetirizine), Zyrtec (cetirizine), or Allegra (fexofenadine) once a day as needed for runny nose  4.  Allergic conjunctivitis -May use cromolyn 4% ophthalmic drops 1 drop each eye up to 4 times a day as needed for itchy watery eyes  5. Pollen food allergy - The oral allergy syndrome (OAS) or pollen-food allergy syndrome (PFAS) is a relatively common form of food allergy, particularly in adults. It typically occurs in people who have pollen allergies when the immune system "sees" proteins on the food that look like proteins on the pollen.  This results in the allergy antibody (IgE) binding to the food instead of the pollen. Patients typically report itching and/or mild swelling of the mouth and throat immediately following ingestion of certain uncooked fruits (including nuts) or raw vegetables. Only a very small number of affected individuals experience systemic allergic reactions, such as anaphylaxis which occurs with true food allergies.   6. Reflux  Start pantoprazole 40 mg once a day .    Continue dietary and lifestyle modifications as listed below  7. Bee sting allergy Avoid bee stings. In case of an allergic reaction, give Benadryl 4 teaspoonfuls every 4 hours, and if life-threatening symptoms occur, inject with EpiPen 0.3 mg. We will get lab work due to your recent reaction. We will call you with results once they are back.   Lifestyle Changes for Controlling GERD When you have GERD, stomach acid feels as if it's backing up toward your mouth. Whether or not you take medication to control your GERD, your symptoms can often be improved with lifestyle changes.   Raise Your Head Reflux is more likely to strike when you're lying down flat, because stomach fluid can flow backward more easily. Raising the head of your bed 4-6 inches can help. To do this: Slide blocks or books under the legs at the head of your bed. Or, place a wedge under the mattress. Many foam stores can make a suitable wedge for you. The wedge should run from your waist to the top of your head. Don't just prop your head on several  pillows. This increases pressure on your stomach. It can make GERD worse.  Watch Your Eating Habits Certain foods may increase the acid in your stomach or relax the lower esophageal sphincter, making GERD more likely. It's best to avoid the following: Coffee, tea, and carbonated drinks (with and without caffeine) Fatty, fried, or spicy food Mint, chocolate, onions, and tomatoes Any other foods that seem to irritate your  stomach or cause you pain  Relieve the Pressure Eat smaller meals, even if you have to eat more often. Don't lie down right after you eat. Wait a few hours for your stomach to empty. Avoid tight belts and tight-fitting clothes. Lose excess weight.  Tobacco and Alcohol Avoid smoking tobacco and drinking alcohol. They can make GERD symptoms worse.  Please let us know if this treatment plan is not working well for you Schedule a follow-up appointment in 6 weeks or sooner if needed

## 2022-02-08 ENCOUNTER — Other Ambulatory Visit: Payer: Self-pay

## 2022-02-08 ENCOUNTER — Telehealth: Payer: Self-pay | Admitting: Family

## 2022-02-08 ENCOUNTER — Other Ambulatory Visit: Payer: Self-pay | Admitting: Family

## 2022-02-08 ENCOUNTER — Encounter: Payer: Self-pay | Admitting: Family

## 2022-02-08 ENCOUNTER — Ambulatory Visit (INDEPENDENT_AMBULATORY_CARE_PROVIDER_SITE_OTHER): Payer: Commercial Managed Care - HMO | Admitting: Family

## 2022-02-08 VITALS — BP 118/80 | HR 64 | Temp 97.6°F | Resp 16 | Ht 68.31 in | Wt 226.7 lb

## 2022-02-08 DIAGNOSIS — H1013 Acute atopic conjunctivitis, bilateral: Secondary | ICD-10-CM

## 2022-02-08 DIAGNOSIS — Z91148 Patient's other noncompliance with medication regimen for other reason: Secondary | ICD-10-CM

## 2022-02-08 DIAGNOSIS — K219 Gastro-esophageal reflux disease without esophagitis: Secondary | ICD-10-CM

## 2022-02-08 DIAGNOSIS — J3089 Other allergic rhinitis: Secondary | ICD-10-CM

## 2022-02-08 DIAGNOSIS — J302 Other seasonal allergic rhinitis: Secondary | ICD-10-CM

## 2022-02-08 DIAGNOSIS — T63441A Toxic effect of venom of bees, accidental (unintentional), initial encounter: Secondary | ICD-10-CM | POA: Diagnosis not present

## 2022-02-08 DIAGNOSIS — J4550 Severe persistent asthma, uncomplicated: Secondary | ICD-10-CM

## 2022-02-08 DIAGNOSIS — T781XXD Other adverse food reactions, not elsewhere classified, subsequent encounter: Secondary | ICD-10-CM

## 2022-02-08 DIAGNOSIS — H1010 Acute atopic conjunctivitis, unspecified eye: Secondary | ICD-10-CM

## 2022-02-08 MED ORDER — ALBUTEROL SULFATE HFA 108 (90 BASE) MCG/ACT IN AERS: INHALATION_SPRAY | RESPIRATORY_TRACT | 1 refills | Status: DC

## 2022-02-08 MED ORDER — FLUTICASONE-SALMETEROL 230-21 MCG/ACT IN AERO: 2.0000 | INHALATION_SPRAY | Freq: Two times a day (BID) | RESPIRATORY_TRACT | 5 refills | Status: DC

## 2022-02-08 MED ORDER — PANTOPRAZOLE SODIUM 40 MG PO TBEC: 40.0000 mg | DELAYED_RELEASE_TABLET | Freq: Every day | ORAL | 0 refills | Status: DC

## 2022-02-08 MED ORDER — SPIRIVA RESPIMAT 1.25 MCG/ACT IN AERS: 2.0000 | INHALATION_SPRAY | Freq: Every day | RESPIRATORY_TRACT | 5 refills | Status: DC

## 2022-02-08 MED ORDER — EPINEPHRINE 0.3 MG/0.3ML IJ SOAJ: 0.3000 mg | INTRAMUSCULAR | 1 refills | Status: AC | PRN

## 2022-02-08 MED ORDER — ALBUTEROL SULFATE (2.5 MG/3ML) 0.083% IN NEBU
2.5000 mg | INHALATION_SOLUTION | RESPIRATORY_TRACT | 1 refills | Status: AC | PRN
Start: 2022-02-08 — End: ?

## 2022-02-08 NOTE — Telephone Encounter (Signed)
Trelegy is also not covered by his insurance. The recommended replacements are not appropriate either per Jeffrey Olson. Only option as of right now is to try and get it approved via [place].

## 2022-02-08 NOTE — Telephone Encounter (Signed)
Called and spoke with Jeffrey Olson about his medication changing to Trelegy 200 mcg 1 puff once a day in place of Adviar HFA and Spriva Respimat.  Nehemiah Settle, FNP

## 2022-02-08 NOTE — Progress Notes (Addendum)
[address] [place] [id number] Dept: [phone]  FOLLOW UP NOTE  Patient ID: Jeffrey Olson, male    DOB: 03/17/2000  Age: 21 y.o. MRN: [id number] Date of Office Visit: 02/08/2022  Assessment  Chief Complaint: Follow-up (Uses his inhaler a little more doing season change otherwise do doesn't need it.)  HPI Jeffrey Olson is a 21 year old male who presents today for follow-up of not well controlled severe persistent asthma, anaphylactic shock due to food, seasonal and perennial allergic rhinitis, allergic conjunctivitis, pollen food allergy, and gastroesophageal reflux disease.  He denies any new diagnosis or surgery since his last office visit.  Severe persistent asthma: He is currently not using Advair HFA 230/21 mcg 2 puffs twice a day as much as he should.  He also does not have Spiriva Respimat 1.25 mcg 2 puffs once a day, because he said his insurance did not cover it.  Discussed that this should be covered and to let us know if he is not able to get this medication.  He reports coughing, wheezing, tightness in chest, shortness of breath, and nocturnal awakenings due to breathing problems around this time of the year and with weather changes.  He has been using his albuterol inhaler every day.  Since his last office visit he has not required any systemic steroids or made any trips to the emergency room or urgent care due to breathing problems.  He does feel like his breathing was better with the sample of Spiriva Respimat he was given at the last office visit.  Anaphylactic shock due to food: He reports that he is no longer avoiding shellfish and is able to eat this without any problems.  Pollen food allergy: He does continue to avoid certain fruits such as pineapple, mangoes, and apple that cause his mouth to itch.  He is able to eat these foods baked without any problems.  Seasonal and perennial allergic rhinitis: He reports rhinorrhea and postnasal drip and denies nasal  congestion.  He does not take any medication for his allergies due to not liking to take a lot of medications.  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  He felt tightness in his chest, shortness of breath, wheezing, swelling of legs, and a rash.  He denies any gastrointestinal symptoms.  He used his EpiPen and almost instantly felt better.  Reflux: He reports very bad heartburn symptoms for which he will eat bread or drink milk.  He does not think that he ever started the pantoprazole that has been prescribed previously.   Drug Allergies:  Allergies  Allergen Reactions   Apple Juice Swelling    Oral swelling    Peach [Prunus Persica] Swelling   Plum Pulp Swelling   Shellfish Allergy Other (See Comments)    Allergy testing     Review of Systems: Review of Systems  Constitutional:  Negative for chills and fever.  HENT:         Reports rhinorrhea at times and postnasal drip at times.  Denies nasal congestion  Eyes:        Reports itchy watery eyes for which the cromolyn eyedrops help  Respiratory:  Positive for cough, shortness of breath and wheezing.        Reports cough, wheeze, tightness in chest, shortness of breath, and nocturnal awakenings due to breathing problems with colder weather and season changes  Cardiovascular:  Negative for chest  pain and palpitations.  Gastrointestinal:        Reports very bad heartburn symptoms  Genitourinary:  Negative for frequency.  Skin:  Negative for itching and rash.  Neurological:  Positive for headaches.       Reports history of migraines  Endo/Heme/Allergies:  Positive for environmental allergies.     Physical Exam: BP 118/80   Pulse 64   Temp 97.6 F (36.4 C)   Resp 16   Ht 5' 8.31" (1.735 m)   Wt 226 lb 11.2 oz (102.8 kg)   SpO2 96%   BMI 34.16 kg/m    Physical Exam Constitutional:      Appearance: Normal appearance.  HENT:     Head:  Normocephalic and atraumatic.     Comments: Pharynx normal, eyes normal, ears normal, nose: Bilateral lower turbinates mildly edematous and slightly erythematous with no drainage noted    Right Ear: Tympanic membrane, ear canal and external ear normal.     Left Ear: Tympanic membrane, ear canal and external ear normal.     Mouth/Throat:     Mouth: Mucous membranes are moist.     Pharynx: Oropharynx is clear.  Eyes:     Conjunctiva/sclera: Conjunctivae normal.  Cardiovascular:     Rate and Rhythm: Normal rate and regular rhythm.     Heart sounds: Normal heart sounds.  Pulmonary:     Effort: Pulmonary effort is normal.     Breath sounds: Normal breath sounds.     Comments: Lungs clear to auscultation Musculoskeletal:     Cervical back: Neck supple.  Skin:    General: Skin is warm.  Neurological:     Mental Status: He is alert and oriented to person, place, and time.  Psychiatric:        Mood and Affect: Mood normal.        Behavior: Behavior normal.        Thought Content: Thought content normal.        Judgment: Judgment normal.     Diagnostics: FVC 4.71 L (91%), FEV1 3.58 L (82%).  Predicted FVC 5.17 L, predicted FEV1 4.38 L.  Spirometry indicates normal respiratory function.  Assessment and Plan: 1. Not well controlled severe persistent asthma   2. Bee sting reaction, accidental or unintentional, initial encounter   3. Seasonal and perennial allergic rhinitis   4. Pollen-food allergy, subsequent encounter   5. Gastroesophageal reflux disease, unspecified whether esophagitis present   6. Allergic conjunctivitis, unspecified laterality   7. Non compliance w medication regimen     Meds ordered this encounter  Medications   albuterol (PROVENTIL) (2.5 MG/3ML) 0.083% nebulizer solution    Sig: Take 3 mLs (2.5 mg total) by nebulization every 4 (four) hours as needed for wheezing or shortness of breath.    Dispense:  75 mL    Refill:  1   albuterol (VENTOLIN HFA) 108 (90  Base) MCG/ACT inhaler    Sig: INHALE 2 PUFFS EVERY 4 HOURS AS NEEDED FOR WHEEZING, SHORTNESS OF BREATH (NEED OFFICE VISIT)    Dispense:  18 g    Refill:  1   fluticasone-salmeterol (ADVAIR HFA) 230-21 MCG/ACT inhaler    Sig: Inhale 2 puffs into the lungs 2 (two) times daily.    Dispense:  1 each    Refill:  5   EPINEPHrine 0.3 mg/0.3 mL IJ SOAJ injection    Sig: Inject 0.3 mg into the muscle as needed for anaphylaxis.    Dispense:  2 each  Refill:  1   Tiotropium Bromide Monohydrate (SPIRIVA RESPIMAT) 1.25 MCG/ACT AERS    Sig: Inhale 2 puffs into the lungs daily.    Dispense:  4 g    Refill:  5   pantoprazole (PROTONIX) 40 MG tablet    Sig: Take 1 tablet (40 mg total) by mouth daily.    Dispense:  90 tablet    Refill:  0    Patient Instructions  1. Severe persistent asthma (reports that Symbicort caused his breathing to feels worse) - Daily controller medication(s): Continue Advair HFA 230/21 mcg using 2 puffs twice a day with spacer. Make sure and use this every day. Set a reminder on your phone or set Advair next to your toothbrush to use prior to brushing your teeth in the morning and at night Start Spiriva Respimat 1.25 mcg 2 puffs once a day. This should be covered by your insurance. Let us know if you are not able to get this medication - Prior to physical activity: albuterol 2 puffs 10-15 minutes before physical activity. - Rescue medications: albuterol 4 puffs every 4-6 hours as needed - Asthma control goals:  * Full participation in all desired activities (may need albuterol before activity) * Albuterol use two time or less a week on average (not counting use with activity) * Cough interfering with sleep two time or less a month * Oral steroids no more than once a year * No hospitalizations  2. Anaphylactic shock due to food ( reports that he is able to eat shellfish with out any problems) -. Avoid fruits. In case of an allergic reaction, give Benadryl 4 teaspoonfuls  every 4 hours, and if life-threatening symptoms occur, inject with EpiPen 0.3 mg. Refill sent    3. Seasonal and perennial allergic rhinitis - you may use an over the counter antihistamine such as Claritin (loratadine), Xyzal (levocetirizine), Zyrtec (cetirizine), or Allegra (fexofenadine) once a day as needed for runny nose  4.  Allergic conjunctivitis -May use cromolyn 4% ophthalmic drops 1 drop each eye up to 4 times a day as needed for itchy watery eyes  5. Pollen food allergy - The oral allergy syndrome (OAS) or pollen-food allergy syndrome (PFAS) is a relatively common form of food allergy, particularly in adults. It typically occurs in people who have pollen allergies when the immune system "sees" proteins on the food that look like proteins on the pollen. This results in the allergy antibody (IgE) binding to the food instead of the pollen. Patients typically report itching and/or mild swelling of the mouth and throat immediately following ingestion of certain uncooked fruits (including nuts) or raw vegetables. Only a very small number of affected individuals experience systemic allergic reactions, such as anaphylaxis which occurs with true food allergies.   6. Reflux  Start pantoprazole 40 mg once a day .    Continue dietary and lifestyle modifications as listed below  7. Bee sting allergy Avoid bee stings. In case of an allergic reaction, give Benadryl 4 teaspoonfuls every 4 hours, and if life-threatening symptoms occur, inject with EpiPen 0.3 mg. We will get lab work due to your recent reaction. We will call you with results once they are back.   Lifestyle Changes for Controlling GERD When you have GERD, stomach acid feels as if it's backing up toward your mouth. Whether or not you take medication to control your GERD, your symptoms can often be improved with lifestyle changes.   Raise Your Head Reflux is more likely to strike  when you're lying down flat, because stomach fluid  can flow backward more easily. Raising the head of your bed 4-6 inches can help. To do this: Slide blocks or books under the legs at the head of your bed. Or, place a wedge under the mattress. Many foam stores can make a suitable wedge for you. The wedge should run from your waist to the top of your head. Don't just prop your head on several pillows. This increases pressure on your stomach. It can make GERD worse.  Watch Your Eating Habits Certain foods may increase the acid in your stomach or relax the lower esophageal sphincter, making GERD more likely. It's best to avoid the following: Coffee, tea, and carbonated drinks (with and without caffeine) Fatty, fried, or spicy food Mint, chocolate, onions, and tomatoes Any other foods that seem to irritate your stomach or cause you pain  Relieve the Pressure Eat smaller meals, even if you have to eat more often. Don't lie down right after you eat. Wait a few hours for your stomach to empty. Avoid tight belts and tight-fitting clothes. Lose excess weight.  Tobacco and Alcohol Avoid smoking tobacco and drinking alcohol. They can make GERD symptoms worse.  Please let us know if this treatment plan is not working well for you Schedule a follow-up appointment in 6 weeks or sooner if needed    Return in about 6 weeks (around 03/22/2022), or if symptoms worsen or fail to improve.    Thank you for the opportunity to care for this patient.  Please do not hesitate to contact me with questions.  Nehemiah Settle, FNP [facility]

## 2022-02-08 NOTE — Telephone Encounter (Signed)
Trelegy contains 3 medication (ICS/ LABA/ and LAMA) the options that they are suggesting are all ICS (inhaled corticosteroids) Advair HFA and Spiriva Respimat 1.25 mcg was denied earlier. Please find out what is covered that has ICS/LABA/ LAMA

## 2022-02-08 NOTE — Telephone Encounter (Signed)
Please find out what the preferred drugs are

## 2022-02-08 NOTE — Addendum Note (Signed)
Addended by: Nehemiah Settle on: 02/08/2022 11:54 AM   Modules accepted: Orders

## 2022-02-09 NOTE — Telephone Encounter (Signed)
Information sent to the PA team to start process.

## 2022-02-09 NOTE — Telephone Encounter (Signed)
Thanks Trayce!!

## 2022-02-10 ENCOUNTER — Other Ambulatory Visit (HOSPITAL_COMMUNITY): Payer: Self-pay

## 2022-02-10 ENCOUNTER — Telehealth: Payer: Self-pay

## 2022-02-10 LAB — TRYPTASE: Tryptase: 6.1 ug/L (ref 2.2–13.2)

## 2022-02-10 NOTE — Telephone Encounter (Signed)
PA approved in another encounter. Please sign off this. Thank you!

## 2022-02-10 NOTE — Telephone Encounter (Signed)
Patient Advocate Encounter  Received notification from [employer] that prior authorization is required for Trelegy Ellipta 200-62.5-25MCG/ACT aerosol powder.  PA submitted and APPROVED on 02-10-2022.  Key [id number] Effective: 02-10-2022 - 02-10-2023

## 2022-02-11 LAB — ALLERGEN HYMENOPTERA PANEL
Bumblebee: 0.16 kU/L — AB
Honeybee IgE: 0.87 kU/L — AB
Hornet, White Face, IgE: 18.2 kU/L — AB
Hornet, Yellow, IgE: 4.3 kU/L — AB
Paper Wasp IgE: 41.6 kU/L — AB
Yellow Jacket, IgE: 36.9 kU/L — AB

## 2022-02-11 NOTE — Telephone Encounter (Signed)
Called pharmacy and informed them of the approval of the PA. Patient has been called and notified of the approval.

## 2022-02-12 NOTE — Progress Notes (Signed)
Please let Jeffrey Olson know that his lab work came back positive honeybee, white faced hornet, yellow jacket, wasp, and yellow hornet. 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 Please make sure that he carries his EpiPen with him at all times.  His tryptase level was normal.

## 2022-03-19 NOTE — Patient Instructions (Incomplete)
1. Severe persistent asthma - not well controlled(reports that Symbicort caused his breathing to feels worse) - Daily controller medication(s): Start Trelegy 200 mcg 1 puff once a day. Rinse mouth out after - Prior to physical activity: albuterol 2 puffs 10-15 minutes before physical activity. - Rescue medications: albuterol 4 puffs every 4-6 hours as needed - Asthma control goals:  * Full participation in all desired activities (may need albuterol before activity) * Albuterol use two time or less a week on average (not counting use with activity) * Cough interfering with sleep two time or less a month * Oral steroids no more than once a year * No hospitalizations  2. Anaphylactic shock due to food ( reports that he is able to eat shellfish with out any problems) -. Avoid fruits. In case of an allergic reaction, give Benadryl 4 teaspoonfuls every 4 hours, and if life-threatening symptoms occur, inject with EpiPen 0.3 mg. Refill sent    3. Seasonal and perennial allergic rhinitis - you may use an over the counter antihistamine such as Claritin (loratadine), Xyzal (levocetirizine), Zyrtec (cetirizine), or Allegra (fexofenadine) once a day as needed for runny nose  4.  Allergic conjunctivitis -May use cromolyn 4% ophthalmic drops 1 drop each eye up to 4 times a day as needed for itchy watery eyes  5. Pollen food allergy - The oral allergy syndrome (OAS) or pollen-food allergy syndrome (PFAS) is a relatively common form of food allergy, particularly in adults. It typically occurs in people who have pollen allergies when the immune system "sees" proteins on the food that look like proteins on the pollen. This results in the allergy antibody (IgE) binding to the food instead of the pollen. Patients typically report itching and/or mild swelling of the mouth and throat immediately following ingestion of certain uncooked fruits (including nuts) or raw vegetables. Only a very small number of affected  individuals experience systemic allergic reactions, such as anaphylaxis which occurs with true food allergies.   6. Reflux  Start pantoprazole 40 mg once a day .    Continue dietary and lifestyle modifications as listed below  7. Bee sting allergy Avoid bee stings. In case of an allergic reaction, give Benadryl 4 teaspoonfuls every 4 hours, and if life-threatening symptoms occur, inject with EpiPen 0.3 mg.  Due to him working in [place] and his reaction I strongly recommend him  starting venom injections. Please make sure that he carries his EpiPen with him at all times. He is interested in [facility] venom immunotherapy. Information given. I will have Vira Agar, a Psychologist, sport and exercise, give you a phone call with more information  .   Lifestyle Changes for Controlling GERD When you have GERD, stomach acid feels as if it's backing up toward your mouth. Whether or not you take medication to control your GERD, your symptoms can often be improved with lifestyle changes.   Raise Your Head Reflux is more likely to strike when you're lying down flat, because stomach fluid can flow backward more easily. Raising the head of your bed 4-6 inches can help. To do this: Slide blocks or books under the legs at the head of your bed. Or, place a wedge under the mattress. Many foam stores can make a suitable wedge for you. The wedge should run from your waist to the top of your head. Don't just prop your head on several pillows. This increases pressure on your stomach. It can make GERD worse.  Watch Your Eating Habits Certain foods may increase the  acid in your stomach or relax the lower esophageal sphincter, making GERD more likely. It's best to avoid the following: Coffee, tea, and carbonated drinks (with and without caffeine) Fatty, fried, or spicy food Mint, chocolate, onions, and tomatoes Any other foods that seem to irritate your stomach or cause you pain  Relieve the Pressure Eat smaller meals,  even if you have to eat more often. Don't lie down right after you eat. Wait a few hours for your stomach to empty. Avoid tight belts and tight-fitting clothes. Lose excess weight.  Tobacco and Alcohol Avoid smoking tobacco and drinking alcohol. They can make GERD symptoms worse.  Please let us know if this treatment plan is not working well for you You should be able to pick up all your medications now. Let us know if you have any problems. Schedule a follow-up appointment in 6 weeks or sooner if needed

## 2022-03-22 ENCOUNTER — Encounter: Payer: Self-pay | Admitting: Family

## 2022-03-22 ENCOUNTER — Other Ambulatory Visit: Payer: Self-pay

## 2022-03-22 ENCOUNTER — Ambulatory Visit: Payer: Medicaid Other | Admitting: Family

## 2022-03-22 VITALS — BP 114/64 | HR 68 | Temp 97.6°F | Resp 14 | Wt 221.8 lb

## 2022-03-22 DIAGNOSIS — J4550 Severe persistent asthma, uncomplicated: Secondary | ICD-10-CM | POA: Diagnosis not present

## 2022-03-22 DIAGNOSIS — T63441D Toxic effect of venom of bees, accidental (unintentional), subsequent encounter: Secondary | ICD-10-CM

## 2022-03-22 DIAGNOSIS — T781XXD Other adverse food reactions, not elsewhere classified, subsequent encounter: Secondary | ICD-10-CM

## 2022-03-22 DIAGNOSIS — J3089 Other allergic rhinitis: Secondary | ICD-10-CM | POA: Diagnosis not present

## 2022-03-22 DIAGNOSIS — Z91148 Patient's other noncompliance with medication regimen for other reason: Secondary | ICD-10-CM

## 2022-03-22 DIAGNOSIS — T63441A Toxic effect of venom of bees, accidental (unintentional), initial encounter: Secondary | ICD-10-CM

## 2022-03-22 DIAGNOSIS — H1013 Acute atopic conjunctivitis, bilateral: Secondary | ICD-10-CM | POA: Diagnosis not present

## 2022-03-22 DIAGNOSIS — H1010 Acute atopic conjunctivitis, unspecified eye: Secondary | ICD-10-CM

## 2022-03-22 DIAGNOSIS — K219 Gastro-esophageal reflux disease without esophagitis: Secondary | ICD-10-CM

## 2022-03-22 DIAGNOSIS — J302 Other seasonal allergic rhinitis: Secondary | ICD-10-CM

## 2022-03-22 NOTE — Progress Notes (Signed)
[place] [address] Dept: [phone]  FOLLOW UP NOTE  Patient ID: Jeffrey Olson, male    DOB: [birth year]  Age: 22 y.o. MRN: [id number] Date of Office Visit: 03/22/2022  Assessment  Chief Complaint: Not well controlled severe persisten asthma and Follow-up  HPI Jeffrey RODRIQUEZ is a 22 year old male who presents today for follow-up of not well-controlled severe persistent asthma, bee sting reaction, seasonal and perennial allergic rhinitis, pollen food allergy, gastroesophageal reflux disease, allergic conjunctivitis, and noncompliance with medication regimen.  He was last seen on February 08, 2022 by myself.  He denies any new diagnosis or surgeries since his last office visit.  Severe persistent asthma: He reports that he went to the pharmacy and was only able to pick up his albuterol.  Discussed that Trelegy was approved on February 10, 2022 and that he was called by our office on November 30 to let him know of the approval.  He reports a little bit of wheezing with weather changes.  He also has some tightness in his chest and shortness of breath.  He does have wheezing that wakes him up sometimes at night and chest congestion.  Since his last office visit he has not required any systemic steroids or made any trips to the emergency room or urgent care due to breathing problems.  He uses his albuterol inhaler approximately 2 times a day and this helps.  Anaphylactic shock due to food: The only foods that he is avoiding at this time is fruit.  He does mention he has an older EpiPen that he has not used.  He was not able to pick up the EpiPen that was prescribed at his last office visit.  Instructed him that he should be able to pick up the EpiPen and to let us know if he is not able to.  After speaking with his pharmacy it was found that he did not ever pick up his prescriptions.  Seasonal and perennial allergic rhinitis.  He reports postnasal drip and denies rhinorrhea and  nasal congestion.  He has not had any sinus infections since we last saw him.  He is currently not taking any medications for his postnasal drip.  Recommended that he could try an over-the-counter antihistamine such as Zyrtec, Claritin, Allegra, or Xyzal to see if this helps.  Allergic conjunctivitis: He denies itchy watery eyes.  Reflux: He reports that he was not able to pick up the pantoprazole that was prescribed at the last office visit.  He reports that he always has heartburn and will occasionally have reflux.  Instructed him that he should be able to pick up the pantoprazole from the pharmacy and to let us know if he is not able to.  Bee sting allergy: He reports that he has not had any insect stings since his last office visit.  He is interested in possibly starting [facility] for venom immunotherapy.  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     Drug Allergies:  Allergies  Allergen Reactions   Apple Juice Swelling    Oral swelling    Peach [Prunus Persica] Swelling   Plum Pulp Swelling   Shellfish Allergy Other (See Comments)    Allergy testing     Review of Systems: Review of Systems  Constitutional:  Negative for chills and fever.  HENT:         Reports postnasal drip and denies rhinorrhea and nasal  congestion  Eyes:        Denies itchy watery eyes  Respiratory:  Positive for shortness of breath and wheezing. Negative for cough.        Reports little wheezing with weather changes.  He also will have a little tightness in his chest and shortness of breath.  He will also sometimes wake up at night due to wheezing or chest congestion.  Cardiovascular:  Negative for chest pain and palpitations.  Gastrointestinal:  Positive for heartburn.       Reports frequent heartburn and occasional reflux.  He is not on any daily medication for his heartburn or reflux  Genitourinary:  Negative for frequency.  Skin:  Negative for itching and  rash.  Neurological:  Negative for headaches.  Endo/Heme/Allergies:  Positive for environmental allergies.     Physical Exam: BP 114/64   Pulse 68   Temp 97.6 F (36.4 C) (Temporal)   Resp 14   Wt 221 lb 12.8 oz (100.6 kg)   SpO2 96%   BMI 33.42 kg/m    Physical Exam Constitutional:      Appearance: Normal appearance.  HENT:     Head: Normocephalic and atraumatic.     Comments: Pharynx normal, eyes normal, ears normal, nose normal    Right Ear: Tympanic membrane, ear canal and external ear normal.     Left Ear: Tympanic membrane, ear canal and external ear normal.     Nose: Nose normal.     Mouth/Throat:     Mouth: Mucous membranes are moist.     Pharynx: Oropharynx is clear.  Eyes:     Conjunctiva/sclera: Conjunctivae normal.  Cardiovascular:     Rate and Rhythm: Normal rate and regular rhythm.     Heart sounds: Normal heart sounds.  Pulmonary:     Effort: Pulmonary effort is normal.     Breath sounds: Normal breath sounds.     Comments: Lungs clear to auscultation Musculoskeletal:     Cervical back: Neck supple.  Skin:    General: Skin is warm.  Neurological:     Mental Status: He is alert and oriented to person, place, and time.  Psychiatric:        Mood and Affect: Mood normal.        Behavior: Behavior normal.        Thought Content: Thought content normal.        Judgment: Judgment normal.    Diagnostics: FVC 5.15 L (113.69%), FEV1 3.53 L (91.45%).  Spirometry indicates mild airway obstruction.  Assessment and Plan: 1. Not well controlled severe persistent asthma   2. Bee sting reaction, accidental or unintentional, initial encounter   3. Gastroesophageal reflux disease, unspecified whether esophagitis present   4. Seasonal and perennial allergic rhinitis   5. Pollen-food allergy, subsequent encounter   6. Allergic conjunctivitis, unspecified laterality   7. Non compliance w medication regimen     No orders of the defined types were placed in this  encounter.   Patient Instructions  1. Severe persistent asthma - not well controlled(reports that Symbicort caused his breathing to feels worse) - Daily controller medication(s): Start Trelegy 200 mcg 1 puff once a day. Rinse mouth out after - Prior to physical activity: albuterol 2 puffs 10-15 minutes before physical activity. - Rescue medications: albuterol 4 puffs every 4-6 hours as needed - Asthma control goals:  * Full participation in all desired activities (may need albuterol before activity) * Albuterol use two time or less a week  on average (not counting use with activity) * Cough interfering with sleep two time or less a month * Oral steroids no more than once a year * No hospitalizations  2. Anaphylactic shock due to food ( reports that he is able to eat shellfish with out any problems) -. Avoid fruits. In case of an allergic reaction, give Benadryl 4 teaspoonfuls every 4 hours, and if life-threatening symptoms occur, inject with EpiPen 0.3 mg. Refill sent    3. Seasonal and perennial allergic rhinitis - you may use an over the counter antihistamine such as Claritin (loratadine), Xyzal (levocetirizine), Zyrtec (cetirizine), or Allegra (fexofenadine) once a day as needed for runny nose  4.  Allergic conjunctivitis -May use cromolyn 4% ophthalmic drops 1 drop each eye up to 4 times a day as needed for itchy watery eyes  5. Pollen food allergy - The oral allergy syndrome (OAS) or pollen-food allergy syndrome (PFAS) is a relatively common form of food allergy, particularly in adults. It typically occurs in people who have pollen allergies when the immune system "sees" proteins on the food that look like proteins on the pollen. This results in the allergy antibody (IgE) binding to the food instead of the pollen. Patients typically report itching and/or mild swelling of the mouth and throat immediately following ingestion of certain uncooked fruits (including nuts) or raw vegetables.  Only a very small number of affected individuals experience systemic allergic reactions, such as anaphylaxis which occurs with true food allergies.   6. Reflux  Start pantoprazole 40 mg once a day .    Continue dietary and lifestyle modifications as listed below  7. Bee sting allergy Avoid bee stings. In case of an allergic reaction, give Benadryl 4 teaspoonfuls every 4 hours, and if life-threatening symptoms occur, inject with EpiPen 0.3 mg.  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 Please make sure that he carries his EpiPen with him at all times. He is interested in [facility] venom immunotherapy. Information given. I will have Eulah Citizen, a Engineer, site, give you a phone call with more information  .   Lifestyle Changes for Controlling GERD When you have GERD, stomach acid feels as if it's backing up toward your mouth. Whether or not you take medication to control your GERD, your symptoms can often be improved with lifestyle changes.   Raise Your Head Reflux is more likely to strike when you're lying down flat, because stomach fluid can flow backward more easily. Raising the head of your bed 4-6 inches can help. To do this: Slide blocks or books under the legs at the head of your bed. Or, place a wedge under the mattress. Many foam stores can make a suitable wedge for you. The wedge should run from your waist to the top of your head. Don't just prop your head on several pillows. This increases pressure on your stomach. It can make GERD worse.  Watch Your Eating Habits Certain foods may increase the acid in your stomach or relax the lower esophageal sphincter, making GERD more likely. It's best to avoid the following: Coffee, tea, and carbonated drinks (with and without caffeine) Fatty, fried, or spicy food Mint, chocolate, onions, and tomatoes Any other foods that seem to irritate your stomach or cause you pain  Relieve  the Pressure Eat smaller meals, even if you have to eat more often. Don't lie down right after you eat. Wait a few hours for your stomach to  empty. Avoid tight belts and tight-fitting clothes. Lose excess weight.  Tobacco and Alcohol Avoid smoking tobacco and drinking alcohol. They can make GERD symptoms worse.  Please let us know if this treatment plan is not working well for you You should be able to pick up all your medications now. Let us know if you have any problems. Schedule a follow-up appointment in 6 weeks or sooner if needed   Return in about 6 weeks (around 05/03/2022), or if symptoms worsen or fail to improve.    Thank you for the opportunity to care for this patient.  Please do not hesitate to contact me with questions.  Nehemiah Settle, FNP [facility]

## 2022-03-23 ENCOUNTER — Other Ambulatory Visit: Payer: Self-pay | Admitting: Family

## 2022-03-24 ENCOUNTER — Other Ambulatory Visit: Payer: Self-pay | Admitting: Family

## 2022-03-24 ENCOUNTER — Telehealth: Payer: Self-pay

## 2022-03-24 ENCOUNTER — Other Ambulatory Visit: Payer: Self-pay

## 2022-03-24 MED ORDER — TRELEGY ELLIPTA 200-62.5-25 MCG/ACT IN AEPB: 1.0000 | INHALATION_SPRAY | Freq: Every day | RESPIRATORY_TRACT | 5 refills | Status: AC

## 2022-03-24 MED ORDER — PANTOPRAZOLE SODIUM 40 MG PO TBEC: 40.0000 mg | DELAYED_RELEASE_TABLET | Freq: Every day | ORAL | 0 refills | Status: AC

## 2022-03-24 MED ORDER — ALBUTEROL SULFATE HFA 108 (90 BASE) MCG/ACT IN AERS: INHALATION_SPRAY | RESPIRATORY_TRACT | 1 refills | Status: DC

## 2022-03-24 MED ORDER — CROMOLYN SODIUM 4 % OP SOLN: OPHTHALMIC | 3 refills | Status: AC

## 2022-03-24 NOTE — Telephone Encounter (Signed)
PA request received via CMM for Pantoprazole Sodium 40MG  dr tablets through [facility].  PA has been submitted and is awaiting determination.   Key: [id number]

## 2022-03-29 MED ORDER — LANSOPRAZOLE 30 MG PO CPDR: DELAYED_RELEASE_CAPSULE | ORAL | 0 refills | Status: DC

## 2022-03-29 NOTE — Telephone Encounter (Signed)
PA has been DENIED for Pantoprazole tablets. Denial letter has been sent to providers office per [facility].

## 2022-03-29 NOTE — Telephone Encounter (Signed)
Preferred alternatives are Lansoprazole and Esomeprazole. Do you want to switch to any of these medications?

## 2022-03-29 NOTE — Telephone Encounter (Signed)
Called patient - No DPR on file - unable to leave a message - per recording, the voicemail has not been set up yet, please try your call again later.  Per provider request sending in Lansoprazole 30 mg 1 tablet by mouth 30 minutes before dinner 1 (ONE) time per day to pharmacy

## 2022-03-29 NOTE — Telephone Encounter (Signed)
Ok to send a 30 day trial of lansoprazole 30 mg once  a day- 30 minutes before dinner

## 2022-03-29 NOTE — Telephone Encounter (Signed)
Denial letter received and attached to patient documents.

## 2022-03-31 ENCOUNTER — Other Ambulatory Visit: Payer: Self-pay | Admitting: Family

## 2022-03-31 NOTE — Progress Notes (Signed)
You welcome.

## 2022-03-31 NOTE — Progress Notes (Signed)
Reach out to pt and he haven't call his insurance yet to get schedule for [facility] appt.

## 2022-05-03 ENCOUNTER — Ambulatory Visit: Payer: Medicaid Other | Admitting: Allergy

## 2022-05-03 NOTE — Progress Notes (Deleted)
Follow Up Note  RE: BARTH REPP MRN: [id number] DOB: [birth year] Date of Office Visit: 05/03/2022  Referring provider: No ref. provider found Primary care provider: Patient, No Pcp Per  Chief Complaint: No chief complaint on file.  History of Present Illness: I had the pleasure of seeing Chia Wheeley for a follow up visit at the [facility] [place] [facility] [place] on 05/03/2022. He is a 22 y.o. male, who is being followed for asthma, food allergy, allergic rhinoconjunctivitis, reflux, bee sting allergy. His previous allergy office visit was on 03/22/2022 with Althea Charon FNP. Today is a regular follow up visit.  1. Severe persistent asthma - not well controlled(reports that Symbicort caused his breathing to feels worse) - Daily controller medication(s): Start Trelegy 200 mcg 1 puff once a day. Rinse mouth out after - Prior to physical activity: albuterol 2 puffs 10-15 minutes before physical activity. - Rescue medications: albuterol 4 puffs every 4-6 hours as needed - Asthma control goals:  * Full participation in all desired activities (may need albuterol before activity) * Albuterol use two time or less a week on average (not counting use with activity) * Cough interfering with sleep two time or less a month * Oral steroids no more than once a year * No hospitalizations   2. Anaphylactic shock due to food ( reports that he is able to eat shellfish with out any problems) -. Avoid fruits. In case of an allergic reaction, give Benadryl 4 teaspoonfuls every 4 hours, and if life-threatening symptoms occur, inject with EpiPen 0.3 mg. Refill sent     3. Seasonal and perennial allergic rhinitis - you may use an over the counter antihistamine such as Claritin (loratadine), Xyzal (levocetirizine), Zyrtec (cetirizine), or Allegra (fexofenadine) once a day as needed for runny nose   4.  Allergic conjunctivitis -May use cromolyn 4% ophthalmic drops 1 drop each eye up to 4 times a day as needed  for itchy watery eyes   5. Pollen food allergy - The oral allergy syndrome (OAS) or pollen-food allergy syndrome (PFAS) is a relatively common form of food allergy, particularly in adults. It typically occurs in people who have pollen allergies when the immune system "sees" proteins on the food that look like proteins on the pollen. This results in the allergy antibody (IgE) binding to the food instead of the pollen. Patients typically report itching and/or mild swelling of the mouth and throat immediately following ingestion of certain uncooked fruits (including nuts) or raw vegetables. Only a very small number of affected individuals experience systemic allergic reactions, such as anaphylaxis which occurs with true food allergies.    6. Reflux  Start pantoprazole 40 mg once a day .    Continue dietary and lifestyle modifications as listed below   7. Bee sting allergy Avoid bee stings. In case of an allergic reaction, give Benadryl 4 teaspoonfuls every 4 hours, and if life-threatening symptoms occur, inject with EpiPen 0.3 mg.  Due to him working in [place] and his reaction I strongly recommend him  starting venom injections. Please make sure that he carries his EpiPen with him at all times. He is interested in [facility] venom immunotherapy. Information given. I will have Vira Agar, a Psychologist, sport and exercise, give you a phone call with more information   Not well controlled severe persistent asthma Patient has been having issues with his breathing and using rescue inhaler twice a day with good benefit. No triggers noted. Took prednisone for 3 days due to his asthma  this week. Last Xolair injection was in March 2020 which helped but found it difficult to come into the office every 2 weeks. Today's ACT score 9. Today's spirometry showed: normal but recently took prednisone. Get bloodwork in 2 weeks (checking eosinophils and IgE level) - no prednisone for 2 weeks before the bloodwork. Depending on results  will choose asthma biologics. Preferably one that is given less frequently and offers self injection at home.  Discussed smoking cessation.  Daily controller medication(s): continue Breo 200 1 puff ONCE a day and rinse mouth afterwards.  Start Spiriva respimat 1.66mg 2 puffs once a day. Sample given. Demonstrated proper use.  May use albuterol rescue inhaler 2 puffs or nebulizer every 4 to 6 hours as needed for shortness of breath, chest tightness, coughing, and wheezing. May use albuterol rescue inhaler 2 puffs 5 to 15 minutes prior to strenuous physical activities. Monitor frequency of use.  Repeat spirometry at next visit.    Other allergic rhinitis Past history - 2015 skin testing was positive to grass, weed, ragweed, trees, mold, dust mite, cat, dog, cockroach. Interim history - Stable with no medications.  May use over the counter antihistamines such as Zyrtec (cetirizine), Claritin (loratadine), Allegra (fexofenadine), or Xyzal (levocetirizine) daily as needed. Will check allergy panel via bloodwork with above lab orders for asthma.    Pollen-food allergy Fresh tree fruits cause perioral pruritus. Discussed that his food triggered oral and throat symptoms are likely caused by oral food allergy syndrome (OFAS). This is caused by cross reactivity of pollen with fresh fruits and vegetables, and nuts. Symptoms are usually localized in the form of itching and burning in mouth and throat. Very rarely it can progress to more severe symptoms. Eating foods in cooked or processed forms usually minimizes symptoms. I recommended avoidance of eating the problem foods, especially during the peak season(s). Sometimes, OFAS can induce severe throat swelling or even a systemic reaction; with such instance, I advised them to report to a local ER. A list of common pollens and food cross-reactivities was provided to the patient.    Adverse reaction to food, subsequent encounter Past history - 2015 skin testing  positive to shrimp and crab. Interim history - tolerates shrimp with no issues.  Continue to avoid shellfish that bothers you. Will get bloodwork. I have prescribed epinephrine injectable and demonstrated proper use. For mild symptoms you can take over the counter antihistamines such as Benadryl and monitor symptoms closely. If symptoms worsen or if you have severe symptoms including breathing issues, throat closure, significant swelling, whole body hives, severe diarrhea and vomiting, lightheadedness then inject epinephrine and seek immediate medical care afterwards. Food action plan given.  Discussed that his food triggered oral and throat symptoms are likely caused by oral food allergy syndrome (OFAS). This is caused by cross reactivity of pollen with fresh fruits and vegetables, and nuts. Symptoms are usually localized in the form of itching and burning in mouth and throat. Very rarely it can progress to more severe symptoms. Eating foods in cooked or processed forms usually minimizes symptoms. I recommended avoidance of eating the problem foods, especially during the peak season(s). Sometimes, OFAS can induce severe throat swelling or even a systemic reaction. A list of common pollens and food cross-reactivities was provided to the patient.   Assessment and Plan: KDhavalis a 22y.o. male with: No problem-specific Assessment & Plan notes found for this encounter.  No follow-ups on file.  No orders of the defined types were placed  in this encounter.  Lab Orders  No laboratory test(s) ordered today    Diagnostics: Spirometry:  Tracings reviewed. His effort: {Blank single:19197::"Good reproducible efforts.","It was hard to get consistent efforts and there is a question as to whether this reflects a maximal maneuver.","Poor effort, data can not be interpreted."} FVC: ***L FEV1: ***L, ***% predicted FEV1/FVC ratio: ***% Interpretation: {Blank single:19197::"Spirometry consistent with mild  obstructive disease","Spirometry consistent with moderate obstructive disease","Spirometry consistent with severe obstructive disease","Spirometry consistent with possible restrictive disease","Spirometry consistent with mixed obstructive and restrictive disease","Spirometry uninterpretable due to technique","Spirometry consistent with normal pattern","No overt abnormalities noted given today's efforts"}.  Please see scanned spirometry results for details.  Skin Testing: {Blank single:19197::"Select foods","Environmental allergy panel","Environmental allergy panel and select foods","Food allergy panel","None","Deferred due to recent antihistamines use"}. *** Results discussed with patient/family.   Medication List:  Current Outpatient Medications  Medication Sig Dispense Refill   albuterol (PROVENTIL) (2.5 MG/3ML) 0.083% nebulizer solution Take 3 mLs (2.5 mg total) by nebulization every 4 (four) hours as needed for wheezing or shortness of breath. 75 mL 1   albuterol (VENTOLIN HFA) 108 (90 Base) MCG/ACT inhaler INHALE 2 PUFFS EVERY 4 HOURS AS NEEDED FOR WHEEZING, SHORTNESS OF BREATH (NEED OFFICE VISIT) 18 each 1   cromolyn (OPTICROM) 4 % ophthalmic solution Place 1 drop in each eye up to 4 times a day as needed for itchy watery eyes 10 mL 3   EPINEPHrine 0.3 mg/0.3 mL IJ SOAJ injection Inject 0.3 mg into the muscle as needed for anaphylaxis. 2 each 1   Fluticasone-Umeclidin-Vilant (TRELEGY ELLIPTA) 200-62.5-25 MCG/ACT AEPB Inhale 1 puff into the lungs daily. 60 each 5   ibuprofen (ADVIL,MOTRIN) 200 MG tablet Take 400 mg by mouth daily as needed for moderate pain.     lansoprazole (PREVACID) 30 MG capsule TAKE 1 CAPSULE BY MOUTH 30 MINUTES BEFORE DINNER 1(ONE) TIME PER DAY. 90 capsule 1   pantoprazole (PROTONIX) 40 MG tablet Take 1 tablet (40 mg total) by mouth daily. 90 tablet 0   XOLAIR 150 MG injection INJECT 375 MG INTO THE SKIN EVERY 14 (FOURTEEN) DAYS. 6 each 11   Current  Facility-Administered Medications  Medication Dose Route Frequency Provider Last Rate Last Admin   omalizumab Arvid Right) injection 375 mg  375 mg Subcutaneous Q14 Days Kennith Gain, MD   375 mg at 05/17/18 1706   Allergies: Allergies  Allergen Reactions   Apple Juice Swelling    Oral swelling    Peach [Prunus Persica] Swelling   Plum Pulp Swelling   Shellfish Allergy Other (See Comments)    Allergy testing    I reviewed his past medical history, social history, family history, and environmental history and no significant changes have been reported from his previous visit.  Review of Systems  Constitutional:  Negative for appetite change, chills, fever and unexpected weight change.  HENT:  Negative for congestion and rhinorrhea.   Eyes:  Negative for itching.  Respiratory:  Negative for cough, chest tightness, shortness of breath and wheezing.   Gastrointestinal:  Negative for abdominal pain.  Skin:  Negative for rash.  Allergic/Immunologic: Positive for environmental allergies and food allergies.  Neurological:  Negative for headaches.    Objective: There were no vitals taken for this visit. There is no height or weight on file to calculate BMI. Physical Exam Vitals and nursing note reviewed.  Constitutional:      Appearance: Normal appearance. He is well-developed.  HENT:     Head: Normocephalic and atraumatic.  Right Ear: Tympanic membrane and external ear normal.     Left Ear: Tympanic membrane and external ear normal.     Nose: Nose normal.     Mouth/Throat:     Mouth: Mucous membranes are moist.     Pharynx: Oropharynx is clear.  Eyes:     Conjunctiva/sclera: Conjunctivae normal.  Cardiovascular:     Rate and Rhythm: Normal rate and regular rhythm.     Heart sounds: Normal heart sounds. No murmur heard. Pulmonary:     Effort: Pulmonary effort is normal.     Breath sounds: Normal breath sounds. No wheezing, rhonchi or rales.  Musculoskeletal:      Cervical back: Neck supple.  Skin:    General: Skin is warm.     Findings: No rash.  Neurological:     Mental Status: He is alert and oriented to person, place, and time.  Psychiatric:        Behavior: Behavior normal.    Previous notes and tests were reviewed. The plan was reviewed with the patient/family, and all questions/concerned were addressed.  It was my pleasure to see Jeffrey Olson today and participate in his care. Please feel free to contact me with any questions or concerns.  Sincerely,  Rexene Alberts, DO Allergy & Immunology  [facility] office: [place] office: [phone]

## 2022-06-25 ENCOUNTER — Other Ambulatory Visit: Payer: Self-pay | Admitting: Family

## 2022-10-04 ENCOUNTER — Other Ambulatory Visit: Payer: Self-pay | Admitting: Family

## 2023-01-31 ENCOUNTER — Other Ambulatory Visit (HOSPITAL_COMMUNITY): Payer: Self-pay

## 2023-05-27 ENCOUNTER — Other Ambulatory Visit: Payer: Self-pay | Admitting: Family

## 2023-05-28 ENCOUNTER — Other Ambulatory Visit: Payer: Self-pay | Admitting: Family
